# Patient Record
Sex: Female | Born: 1955 | Race: White | Hispanic: No | Marital: Single | Smoking: Former smoker
Health system: Southern US, Community
[De-identification: ages and names within clinical notes are randomized; demographics above are authoritative.]

## PROBLEM LIST (undated history)

## (undated) DIAGNOSIS — R569 Unspecified convulsions: Secondary | ICD-10-CM

## (undated) DIAGNOSIS — K219 Gastro-esophageal reflux disease without esophagitis: Secondary | ICD-10-CM

## (undated) DIAGNOSIS — I10 Essential (primary) hypertension: Secondary | ICD-10-CM

## (undated) DIAGNOSIS — R198 Other specified symptoms and signs involving the digestive system and abdomen: Secondary | ICD-10-CM

## (undated) DIAGNOSIS — F419 Anxiety disorder, unspecified: Secondary | ICD-10-CM

## (undated) DIAGNOSIS — C4490 Unspecified malignant neoplasm of skin, unspecified: Secondary | ICD-10-CM

## (undated) DIAGNOSIS — J45909 Unspecified asthma, uncomplicated: Secondary | ICD-10-CM

## (undated) DIAGNOSIS — E785 Hyperlipidemia, unspecified: Secondary | ICD-10-CM

## (undated) HISTORY — PX: COLONOSCOPY: SHX174

## (undated) HISTORY — PX: UPPER GI ENDOSCOPY: SHX6162

## (undated) HISTORY — PX: ABDOMINAL HYSTERECTOMY: SHX81

## (undated) HISTORY — PX: HERNIA REPAIR: SHX51

## (undated) HISTORY — PX: CHOLECYSTECTOMY: SHX55

## (undated) HISTORY — PX: BRAIN SURGERY: SHX531

---

## 2012-05-12 HISTORY — PX: CRANIOTOMY: SHX93

## 2014-02-09 ENCOUNTER — Emergency Department (HOSPITAL_COMMUNITY)
Admission: EM | Admit: 2014-02-09 | Discharge: 2014-02-10 | Disposition: A | Discharge status: Home / Self Care | Payer: Medicare Other | Attending: Emergency Medicine | Admitting: Emergency Medicine

## 2014-02-09 ENCOUNTER — Encounter (HOSPITAL_COMMUNITY): Payer: Self-pay | Admitting: Emergency Medicine

## 2014-02-09 DIAGNOSIS — R112 Nausea with vomiting, unspecified: Secondary | ICD-10-CM | POA: Insufficient documentation

## 2014-02-09 DIAGNOSIS — Z87891 Personal history of nicotine dependence: Secondary | ICD-10-CM | POA: Insufficient documentation

## 2014-02-09 DIAGNOSIS — Z8639 Personal history of other endocrine, nutritional and metabolic disease: Secondary | ICD-10-CM | POA: Diagnosis not present

## 2014-02-09 DIAGNOSIS — R202 Paresthesia of skin: Secondary | ICD-10-CM | POA: Insufficient documentation

## 2014-02-09 DIAGNOSIS — R197 Diarrhea, unspecified: Secondary | ICD-10-CM | POA: Insufficient documentation

## 2014-02-09 DIAGNOSIS — R109 Unspecified abdominal pain: Secondary | ICD-10-CM | POA: Diagnosis not present

## 2014-02-09 DIAGNOSIS — R509 Fever, unspecified: Secondary | ICD-10-CM | POA: Insufficient documentation

## 2014-02-09 DIAGNOSIS — R0789 Other chest pain: Secondary | ICD-10-CM | POA: Insufficient documentation

## 2014-02-09 DIAGNOSIS — I10 Essential (primary) hypertension: Secondary | ICD-10-CM | POA: Insufficient documentation

## 2014-02-09 DIAGNOSIS — Z8719 Personal history of other diseases of the digestive system: Secondary | ICD-10-CM | POA: Insufficient documentation

## 2014-02-09 DIAGNOSIS — Z8669 Personal history of other diseases of the nervous system and sense organs: Secondary | ICD-10-CM | POA: Diagnosis not present

## 2014-02-09 DIAGNOSIS — R42 Dizziness and giddiness: Secondary | ICD-10-CM | POA: Insufficient documentation

## 2014-02-09 DIAGNOSIS — R079 Chest pain, unspecified: Secondary | ICD-10-CM | POA: Diagnosis present

## 2014-02-09 HISTORY — DX: Hyperlipidemia, unspecified: E78.5

## 2014-02-09 HISTORY — DX: Essential (primary) hypertension: I10

## 2014-02-09 LAB — COMPREHENSIVE METABOLIC PANEL
ALT: 25 U/L (ref 0–35)
ANION GAP: 15 (ref 5–15)
AST: 25 U/L (ref 0–37)
Albumin: 4.5 g/dL (ref 3.5–5.2)
Alkaline Phosphatase: 68 U/L (ref 39–117)
BILIRUBIN TOTAL: 0.3 mg/dL (ref 0.3–1.2)
BUN: 10 mg/dL (ref 6–23)
CO2: 24 mEq/L (ref 19–32)
CREATININE: 0.78 mg/dL (ref 0.50–1.10)
Calcium: 9.6 mg/dL (ref 8.4–10.5)
Chloride: 105 mEq/L (ref 96–112)
GFR calc non Af Amer: >90 mL/min (ref >90)
Glucose, Bld: 87 mg/dL (ref 70–99)
POTASSIUM: 4 meq/L (ref 3.7–5.3)
Sodium: 144 mEq/L (ref 137–147)
Total Protein: 7.4 g/dL (ref 6.0–8.3)

## 2014-02-09 LAB — URINALYSIS, ROUTINE W REFLEX MICROSCOPIC
BILIRUBIN URINE: NEGATIVE
GLUCOSE, UA: NEGATIVE mg/dL
HGB URINE DIPSTICK: NEGATIVE
KETONES UR: 40 mg/dL — AB
LEUKOCYTES UA: NEGATIVE
Nitrite: NEGATIVE
Protein, ur: NEGATIVE mg/dL
Specific Gravity, Urine: 1.007 (ref 1.005–1.030)
Urobilinogen, UA: 0.2 mg/dL (ref 0.0–1.0)
pH: 5.5 (ref 5.0–8.0)

## 2014-02-09 LAB — CBC WITH DIFFERENTIAL/PLATELET
Basophils Absolute: 0 10*3/uL (ref 0.0–0.1)
Basophils Relative: 0 % (ref 0–1)
Eosinophils Absolute: 0.2 10*3/uL (ref 0.0–0.7)
Eosinophils Relative: 2 % (ref 0–5)
HEMATOCRIT: 38 % (ref 36.0–46.0)
HEMOGLOBIN: 12.7 g/dL (ref 12.0–15.0)
Lymphocytes Relative: 31 % (ref 12–46)
Lymphs Abs: 2.4 10*3/uL (ref 0.7–4.0)
MCH: 29.7 pg (ref 26.0–34.0)
MCHC: 33.4 g/dL (ref 30.0–36.0)
MCV: 89 fL (ref 78.0–100.0)
MONO ABS: 0.7 10*3/uL (ref 0.1–1.0)
MONOS PCT: 9 % (ref 3–12)
NEUTROS ABS: 4.5 10*3/uL (ref 1.7–7.7)
Neutrophils Relative %: 58 % (ref 43–77)
Platelets: 230 10*3/uL (ref 150–400)
RBC: 4.27 MIL/uL (ref 3.87–5.11)
RDW: 13.1 % (ref 11.5–15.5)
WBC: 7.8 10*3/uL (ref 4.0–10.5)

## 2014-02-09 LAB — I-STAT TROPONIN, ED
Troponin i, poc: 0 ng/mL (ref 0.00–0.08)
Troponin i, poc: 0 ng/mL (ref 0.00–0.08)

## 2014-02-09 LAB — LIPASE, BLOOD: LIPASE: 22 U/L (ref 11–59)

## 2014-02-09 MED ORDER — ONDANSETRON HCL 4 MG/2ML IJ SOLN
4.0000 mg | Freq: Once | INTRAMUSCULAR | Status: AC
  Administered 2014-02-09: 4 mg via INTRAVENOUS
  Filled 2014-02-09: qty 2

## 2014-02-09 MED ORDER — SODIUM CHLORIDE 0.9 % IV BOLUS (SEPSIS)
1000.0000 mL | Freq: Once | INTRAVENOUS | Status: AC
  Administered 2014-02-09: 1000 mL via INTRAVENOUS

## 2014-02-09 MED ORDER — LOPERAMIDE HCL 2 MG PO CAPS
4.0000 mg | ORAL_CAPSULE | Freq: Once | ORAL | Status: AC
  Administered 2014-02-09: 4 mg via ORAL
  Filled 2014-02-09: qty 2

## 2014-02-09 MED ORDER — LOPERAMIDE HCL 2 MG PO CAPS: 2.0000 mg | ORAL_CAPSULE | Freq: Four times a day (QID) | ORAL | Status: DC | PRN

## 2014-02-09 MED ORDER — ONDANSETRON 4 MG PO TBDP: 4.0000 mg | ORAL_TABLET | Freq: Three times a day (TID) | ORAL | Status: DC | PRN

## 2014-02-09 NOTE — ED Provider Notes (Signed)
TIME SEEN: 8:45 PM  CHIEF COMPLAINT: Nausea, vomiting, diarrhea, chest pain, lower abdominal pain, left foot burning  HPI: Patient is a 58 year old female with history of hypertension, hyperlipidemia, hiatal hernia, meningioma status post resection who presents to the emergency department with multiple complaints. She reports that since last Tuesday, 8 days ago she started having mild diarrhea that has progressed and she is also had nausea and vomiting over the past several days. She has had subjective fevers but no chills. She is also having a pressure in her suprapubic region but no dysuria, hematuria, urinary frequency or urgency. She denies any sick contacts or recent travel or antibiotic use. She was admitted to [facility] and discharged yesterday for intractable vomiting. She reports she had a CT scan which was normal. She has had a prior history of a cholecystectomy, hysterectomy and unilateral oophorectomy but she cannot remember which side.   She is also complaining of left sharp chest pain that has been intermittent for the past 2 days. She reports that it is always worse after taking her Klonopin. This is a chronic symptom for her but states it feels worse today. It is not exertional or pleuritic. No associated shortness of breath. She has had some lightheadedness. She is also complaining of feeling like his left foot is burning. No history of injury to this extremity. No numbness or weakness.  ROS: See HPI Constitutional: no fever  Eyes: no drainage  ENT: no runny nose   Cardiovascular:   chest pain  Resp: no SOB  GI:  vomiting GU: no dysuria Integumentary: no rash  Allergy: no hives  Musculoskeletal: no leg swelling  Neurological: no slurred speech ROS otherwise negative  PAST MEDICAL HISTORY/PAST SURGICAL HISTORY:  Past Medical History  Diagnosis Date  . Hypertension   . Hyperlipemia     MEDICATIONS:  Prior to Admission medications   Not on File     ALLERGIES:  No Known Allergies  SOCIAL HISTORY:  History  Substance Use Topics  . Smoking status: Former Research scientist (life sciences)  . Smokeless tobacco: Not on file  . Alcohol Use: No    FAMILY HISTORY: History reviewed. No pertinent family history.  EXAM: BP 156/66  Pulse 82  Temp(Src) 97.9 F (36.6 C) (Oral)  Resp 18  SpO2 97% CONSTITUTIONAL: Alert and oriented and responds appropriately to questions. Well-appearing; well-nourished HEAD: Normocephalic EYES: Conjunctivae clear, PERRL ENT: normal nose; no rhinorrhea; moist mucous membranes; pharynx without lesions noted NECK: Supple, no meningismus, no LAD  CARD: RRR; S1 and S2 appreciated; no murmurs, no clicks, no rubs, no gallops RESP: Normal chest excursion without splinting or tachypnea; breath sounds clear and equal bilaterally; no wheezes, no rhonchi, no rales,  ABD/GI: Normal bowel sounds; non-distended; soft, non-tender, no rebound, no guarding BACK:  The back appears normal and is non-tender to palpation, there is no CVA tenderness EXT: Normal ROM in all joints; non-tender to palpation; no edema; normal capillary refill; no cyanosis; no calf tenderness or swelling, 2+ DP pulses bilaterally, no induration or warmth or erythema; equal pulses in all extremities SKIN: Normal color for age and race; warm NEURO: Moves all extremities equally; sensation to light touch intact diffusely PSYCH: The patient's mood and manner are appropriate. Grooming and personal hygiene are appropriate.  MEDICAL DECISION MAKING: Patient here with multiple complaints. She is complaining of 8 days of nausea, vomiting and diarrhea. Her abdominal exam is benign. Labs ordered and triaged are normal. There is no leukocytosis, electrolyte abnormality. Urinalysis is  pending. Low suspicion for any surgical intra-abdominal pathology. Likely viral illness. Will treat symptomatically with Zofran, Imodium and IV fluids. We'll attempt to obtain records from [facility] hospital including recent CT scan results.   As for patient's chest pain, it appears to be very atypical. She does have risk factors for ACS but have low suspicion for this. Her EKG shows no ischemic changes or arrhythmia. First troponin is negative. Will obtain a second troponin and continue to monitor her on a cardiac monitor. Doubt pulmonary embolus or dissection she is very well-appearing, comfortable, equal pulses in all of her extremities, no shortness of breath, no tearing or ripping pain, no pleuritic chest pain.  As for the burning in her left foot, this may be related to neuropathy. She is neurovascularly intact distally. No sign of infection on exam. History of injury. I do not feel this needs emergent workup today when she can followup with her PCP for this.    ED PROGRESS: Records obtained from [facility] regional show the patient has a mild leukocytosis of 13.8 on 9/29 which resolved by 9/30. She also had negative cardiac enzyme on 9/29. CT scan showed mild diffuse fatty infiltration of the liver. There are air-fluid levels throughout the colon compatible with diarrhea. No colonic wall thickening. There is a periumbilical hernia that contains fat.. Pelvic cyst noted in the kidneys. Symptoms were thought secondary to gastroenteritis. She has not had any episodes of vomiting or diarrhea in the ED. Second troponin tonight is negative.  Urine shows no sign of infection. She has no risk factors for pulmonary embolus other than her recent admission but was having chest pain before that. Chest pain is very atypical and not associated with other symptoms. I feel she is safe to be discharged home. Discussed with patient she should followup with her primary care physician. Discussed with her that she should use Imodium over-the-counter for diarrhea. We'll discharge with Zofran. Discussed return precautions. She verbalized understanding and is comfortable with plan.   EKG  Interpretation  Date/Time:  Thursday February 09 2014 18:14:39 EDT Ventricular Rate:  82 PR Interval:  164 QRS Duration: 76 QT Interval:  352 QTC Calculation: 411 R Axis:   56 Text Interpretation:  Normal sinus rhythm Low voltage QRS Nonspecific ST abnormality Abnormal ECG Confirmed by Esra Frankowski,  DO, Giorgi Debruin ([id number]) on 02/09/2014 8:39:17 PM         Dresser, DO 02/09/14 2317

## 2014-02-09 NOTE — Discharge Instructions (Signed)
Chest Pain (Nonspecific) °It is often hard to give a specific diagnosis for the cause of chest pain. There is always a chance that your pain could be related to something serious, such as a heart attack or a blood clot in the lungs. You need to follow up with your health care provider for further evaluation. °CAUSES  °· Heartburn. °· Pneumonia or bronchitis. °· Anxiety or stress. °· Inflammation around your heart (pericarditis) or lung (pleuritis or pleurisy). °· A blood clot in the lung. °· A collapsed lung (pneumothorax). It can develop suddenly on its own (spontaneous pneumothorax) or from trauma to the chest. °· Shingles infection (herpes zoster virus). °The chest wall is composed of bones, muscles, and cartilage. Any of these can be the source of the pain. °· The bones can be bruised by injury. °· The muscles or cartilage can be strained by coughing or overwork. °· The cartilage can be affected by inflammation and become sore (costochondritis). °DIAGNOSIS  °Lab tests or other studies may be needed to find the cause of your pain. Your health care provider may have you take a test called an ambulatory electrocardiogram (ECG). An ECG records your heartbeat patterns over a 24-hour period. You may also have other tests, such as: °· Transthoracic echocardiogram (TTE). During echocardiography, sound waves are used to evaluate how blood flows through your heart. °· Transesophageal echocardiogram (TEE). °· Cardiac monitoring. This allows your health care provider to monitor your heart rate and rhythm in real time. °· Holter monitor. This is a portable device that records your heartbeat and can help diagnose heart arrhythmias. It allows your health care provider to track your heart activity for several days, if needed. °· Stress tests by exercise or by giving medicine that makes the heart beat faster. °TREATMENT  °· Treatment depends on what may be causing your chest pain. Treatment may include: °¨ Acid blockers for  heartburn. °¨ Anti-inflammatory medicine. °¨ Pain medicine for inflammatory conditions. °¨ Antibiotics if an infection is present. °· You may be advised to change lifestyle habits. This includes stopping smoking and avoiding alcohol, caffeine, and chocolate. °· You may be advised to keep your head raised (elevated) when sleeping. This reduces the chance of acid going backward from your stomach into your esophagus. °Most of the time, nonspecific chest pain will improve within 2-3 days with rest and mild pain medicine.  °HOME CARE INSTRUCTIONS  °· If antibiotics were prescribed, take them as directed. Finish them even if you start to feel better. °· For the next few days, avoid physical activities that bring on chest pain. Continue physical activities as directed. °· Do not use any tobacco products, including cigarettes, chewing tobacco, or electronic cigarettes. °· Avoid drinking alcohol. °· Only take medicine as directed by your health care provider. °· Follow your health care provider's suggestions for further testing if your chest pain does not go away. °· Keep any follow-up appointments you made. If you do not go to an appointment, you could develop lasting (chronic) problems with pain. If there is any problem keeping an appointment, call to reschedule. °SEEK MEDICAL CARE IF:  °· Your chest pain does not go away, even after treatment. °· You have a rash with blisters on your chest. °· You have a fever. °SEEK IMMEDIATE MEDICAL CARE IF:  °· You have increased chest pain or pain that spreads to your arm, neck, jaw, back, or abdomen. °· You have shortness of breath. °· You have an increasing cough, or you cough   up blood.  You have severe back or abdominal pain.  You feel nauseous or vomit.  You have severe weakness.  You faint.  You have chills. This is an emergency. Do not wait to see if the pain will go away. Get medical help at once. Call your local emergency services (911 in [place] Do not drive  yourself to the hospital. MAKE SURE YOU:   Understand these instructions.  Will watch your condition.  Will get help right away if you are not doing well or get worse. Document Released: 02/05/2005 Document Revised: 05/03/2013 Document Reviewed: 12/02/2007 [facility] [address] Patient Information 2015 [place], [place]. This information is not intended to replace advice given to you by your health care provider. Make sure you discuss any questions you have with your health care provider.    Viral Gastroenteritis Viral gastroenteritis is also known as stomach flu. This condition affects the stomach and intestinal tract. It can cause sudden diarrhea and vomiting. The illness typically lasts 3 to 8 days. Most people develop an immune response that eventually gets rid of the virus. While this natural response develops, the virus can make you quite ill. CAUSES  Many different viruses can cause gastroenteritis, such as rotavirus or noroviruses. You can catch one of these viruses by consuming contaminated food or water. You may also catch a virus by sharing utensils or other personal items with an infected person or by touching a contaminated surface. SYMPTOMS  The most common symptoms are diarrhea and vomiting. These problems can cause a severe loss of body fluids (dehydration) and a body salt (electrolyte) imbalance. Other symptoms may include:  Fever.  Headache.  Fatigue.  Abdominal pain. DIAGNOSIS  Your caregiver can usually diagnose viral gastroenteritis based on your symptoms and a physical exam. A stool sample may also be taken to test for the presence of viruses or other infections. TREATMENT  This illness typically goes away on its own. Treatments are aimed at rehydration. The most serious cases of viral gastroenteritis involve vomiting so severely that you are not able to keep fluids down. In these cases, fluids must be given through an intravenous line (IV). HOME CARE INSTRUCTIONS   Drink  enough fluids to keep your urine clear or pale yellow. Drink small amounts of fluids frequently and increase the amounts as tolerated.  Ask your caregiver for specific rehydration instructions.  Avoid:  Foods high in sugar.  Alcohol.  Carbonated drinks.  Tobacco.  Juice.  Caffeine drinks.  Extremely hot or cold fluids.  Fatty, greasy foods.  Too much intake of anything at one time.  Dairy products until 24 to 48 hours after diarrhea stops.  You may consume probiotics. Probiotics are active cultures of beneficial bacteria. They may lessen the amount and number of diarrheal stools in adults. Probiotics can be found in yogurt with active cultures and in supplements.  Wash your hands well to avoid spreading the virus.  Only take over-the-counter or prescription medicines for pain, discomfort, or fever as directed by your caregiver. Do not give aspirin to children. Antidiarrheal medicines are not recommended.  Ask your caregiver if you should continue to take your regular prescribed and over-the-counter medicines.  Keep all follow-up appointments as directed by your caregiver. SEEK IMMEDIATE MEDICAL CARE IF:   You are unable to keep fluids down.  You do not urinate at least once every 6 to 8 hours.  You develop shortness of breath.  You notice blood in your stool or vomit. This may look like coffee  grounds.  You have abdominal pain that increases or is concentrated in one small area (localized).  You have persistent vomiting or diarrhea.  You have a fever.  The patient is a child younger than 3 months, and he or she has a fever.  The patient is a child older than 3 months, and he or she has a fever and persistent symptoms.  The patient is a child older than 3 months, and he or she has a fever and symptoms suddenly get worse.  The patient is a baby, and he or she has no tears when crying. MAKE SURE YOU:   Understand these instructions.  Will watch your  condition.  Will get help right away if you are not doing well or get worse. Document Released: 04/28/2005 Document Revised: 07/21/2011 Document Reviewed: 02/12/2011 [facility] Patient Information 2015 [place], [place]. This information is not intended to replace advice given to you by your health care provider. Make sure you discuss any questions you have with your health care provider.

## 2014-02-09 NOTE — ED Notes (Addendum)
Left side chest pain, diarrhea (green watery stools), vomiting - cannot hold food down.  States, "feels like feet are burning." feels like she has having fevers. Diarrhea came before cp.

## 2014-03-30 ENCOUNTER — Encounter (HOSPITAL_BASED_OUTPATIENT_CLINIC_OR_DEPARTMENT_OTHER): Payer: Self-pay | Admitting: *Deleted

## 2014-03-30 NOTE — Progress Notes (Signed)
Pt poor historian, brother was there with her. She was in er 10/15 with n/v/d Put on immodium,carafate Says better Had a craniotomy 2014-[facility]-no seizures since then Will need istat ekg done 10/15

## 2014-03-30 NOTE — H&P (Signed)
  Subjective:    Patient ID: Monica Booth is a 58 y.o. female.  HPI  Referred by Dr. Danielle Dess following Mohs resection yesterday for wound closure/reconstruction. This is first skin cancer. 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 No seizures since.  Review of Systems     Objective:    Physical Exam  HENT:  Open wound right cheek preauricular up to but not involving lobule 5.5 x 3.5 cm, SMAS intact CN II-XII equal symmetric  Cardiovascular: Normal rate, regular rhythm and normal heart sounds.  Pulmonary/Chest: Effort normal and breath sounds normal.       Assessment:      Mohs resection cheek Open wound cheek complicated     Plan:      Plan local tissue transfer in OR under sedation. Pt declined to look at wound today and redressed and counseled ok to shower body. Counseled will have asymmetry face and will require months for scars to mature, soften. May have temporary nerve palsy from dissection. Also risk sialocele given location over parotid. Pictures taken.   Irene Limbo, MD [facility] Plastic & Reconstructive Surgery [phone]

## 2014-03-31 ENCOUNTER — Ambulatory Visit (HOSPITAL_BASED_OUTPATIENT_CLINIC_OR_DEPARTMENT_OTHER): Payer: Medicare Other | Admitting: Anesthesiology

## 2014-03-31 ENCOUNTER — Encounter (HOSPITAL_BASED_OUTPATIENT_CLINIC_OR_DEPARTMENT_OTHER): Payer: Self-pay | Admitting: *Deleted

## 2014-03-31 ENCOUNTER — Ambulatory Visit (HOSPITAL_BASED_OUTPATIENT_CLINIC_OR_DEPARTMENT_OTHER)
Admission: RE | Admit: 2014-03-31 | Discharge: 2014-03-31 | Disposition: A | Discharge status: Home / Self Care | Payer: Medicare Other | Source: Ambulatory Visit | Attending: Plastic Surgery | Admitting: Plastic Surgery

## 2014-03-31 ENCOUNTER — Encounter (HOSPITAL_BASED_OUTPATIENT_CLINIC_OR_DEPARTMENT_OTHER)
Admission: RE | Disposition: A | Discharge status: Home / Self Care | Payer: Self-pay | Source: Ambulatory Visit | Attending: Plastic Surgery

## 2014-03-31 DIAGNOSIS — E785 Hyperlipidemia, unspecified: Secondary | ICD-10-CM | POA: Diagnosis not present

## 2014-03-31 DIAGNOSIS — Y929 Unspecified place or not applicable: Secondary | ICD-10-CM | POA: Diagnosis not present

## 2014-03-31 DIAGNOSIS — I10 Essential (primary) hypertension: Secondary | ICD-10-CM | POA: Diagnosis not present

## 2014-03-31 DIAGNOSIS — J45909 Unspecified asthma, uncomplicated: Secondary | ICD-10-CM | POA: Insufficient documentation

## 2014-03-31 DIAGNOSIS — S01401A Unspecified open wound of right cheek and temporomandibular area, initial encounter: Secondary | ICD-10-CM | POA: Insufficient documentation

## 2014-03-31 DIAGNOSIS — Z87891 Personal history of nicotine dependence: Secondary | ICD-10-CM | POA: Insufficient documentation

## 2014-03-31 DIAGNOSIS — X58XXXA Exposure to other specified factors, initial encounter: Secondary | ICD-10-CM | POA: Diagnosis not present

## 2014-03-31 HISTORY — DX: Gastro-esophageal reflux disease without esophagitis: K21.9

## 2014-03-31 HISTORY — DX: Unspecified asthma, uncomplicated: J45.909

## 2014-03-31 HISTORY — DX: Other specified symptoms and signs involving the digestive system and abdomen: R19.8

## 2014-03-31 HISTORY — PX: SKIN SPLIT GRAFT: SHX444

## 2014-03-31 HISTORY — DX: Unspecified convulsions: R56.9

## 2014-03-31 HISTORY — DX: Anxiety disorder, unspecified: F41.9

## 2014-03-31 LAB — POCT I-STAT, CHEM 8
BUN: 10 mg/dL (ref 6–23)
CREATININE: 0.7 mg/dL (ref 0.50–1.10)
Calcium, Ion: 1.28 mmol/L — ABNORMAL HIGH (ref 1.12–1.23)
Chloride: 103 mEq/L (ref 96–112)
GLUCOSE: 117 mg/dL — AB (ref 70–99)
HCT: 42 % (ref 36.0–46.0)
HEMOGLOBIN: 14.3 g/dL (ref 12.0–15.0)
Potassium: 4.2 mEq/L (ref 3.7–5.3)
Sodium: 142 mEq/L (ref 137–147)
TCO2: 24 mmol/L (ref 0–100)

## 2014-03-31 SURGERY — APPLICATION, GRAFT, SKIN, SPLIT-THICKNESS
Anesthesia: General | Laterality: Right

## 2014-03-31 MED ORDER — PROPOFOL 10 MG/ML IV BOLUS
INTRAVENOUS | Status: AC
  Filled 2014-03-31: qty 80

## 2014-03-31 MED ORDER — LIDOCAINE HCL (CARDIAC) 20 MG/ML IV SOLN
INTRAVENOUS | Status: DC | PRN
  Administered 2014-03-31: 50 mg via INTRAVENOUS

## 2014-03-31 MED ORDER — ONDANSETRON HCL 4 MG/2ML IJ SOLN: 4.0000 mg | Freq: Once | INTRAMUSCULAR | Status: DC | PRN

## 2014-03-31 MED ORDER — GLYCOPYRROLATE 0.2 MG/ML IJ SOLN
INTRAMUSCULAR | Status: DC | PRN
  Administered 2014-03-31: 0.2 mg via INTRAVENOUS

## 2014-03-31 MED ORDER — CEFAZOLIN SODIUM-DEXTROSE 2-3 GM-% IV SOLR: 2.0000 g | INTRAVENOUS | Status: DC

## 2014-03-31 MED ORDER — PROPOFOL 10 MG/ML IV EMUL
INTRAVENOUS | Status: AC
  Filled 2014-03-31: qty 50

## 2014-03-31 MED ORDER — FENTANYL CITRATE 0.05 MG/ML IJ SOLN: 50.0000 ug | INTRAMUSCULAR | Status: DC | PRN

## 2014-03-31 MED ORDER — OXYCODONE HCL 5 MG PO TABS
5.0000 mg | ORAL_TABLET | Freq: Once | ORAL | Status: AC | PRN
  Administered 2014-03-31: 5 mg via ORAL

## 2014-03-31 MED ORDER — BUPIVACAINE-EPINEPHRINE 0.25% -1:200000 IJ SOLN
INTRAMUSCULAR | Status: DC | PRN
  Administered 2014-03-31: 9 mL

## 2014-03-31 MED ORDER — HYDROCODONE-ACETAMINOPHEN 5-325 MG PO TABS: 1.0000 | ORAL_TABLET | Freq: Four times a day (QID) | ORAL | Status: DC | PRN

## 2014-03-31 MED ORDER — SUCCINYLCHOLINE CHLORIDE 20 MG/ML IJ SOLN
INTRAMUSCULAR | Status: DC | PRN
  Administered 2014-03-31: 50 mg via INTRAVENOUS

## 2014-03-31 MED ORDER — PROPOFOL 10 MG/ML IV BOLUS
INTRAVENOUS | Status: DC | PRN
  Administered 2014-03-31: 200 mg via INTRAVENOUS
  Administered 2014-03-31: 100 mg via INTRAVENOUS

## 2014-03-31 MED ORDER — CEFAZOLIN SODIUM-DEXTROSE 2-3 GM-% IV SOLR
INTRAVENOUS | Status: AC
  Filled 2014-03-31: qty 50

## 2014-03-31 MED ORDER — FENTANYL CITRATE 0.05 MG/ML IJ SOLN
INTRAMUSCULAR | Status: AC
  Filled 2014-03-31: qty 2

## 2014-03-31 MED ORDER — ALBUTEROL SULFATE HFA 108 (90 BASE) MCG/ACT IN AERS
INHALATION_SPRAY | RESPIRATORY_TRACT | Status: DC | PRN
  Administered 2014-03-31 (×2): 2 via RESPIRATORY_TRACT

## 2014-03-31 MED ORDER — LACTATED RINGERS IV SOLN
INTRAVENOUS | Status: DC
  Administered 2014-03-31 (×2): via INTRAVENOUS

## 2014-03-31 MED ORDER — FENTANYL CITRATE 0.05 MG/ML IJ SOLN
25.0000 ug | INTRAMUSCULAR | Status: DC | PRN
  Administered 2014-03-31: 25 ug via INTRAVENOUS
  Administered 2014-03-31 (×2): 50 ug via INTRAVENOUS

## 2014-03-31 MED ORDER — DEXAMETHASONE SODIUM PHOSPHATE 4 MG/ML IJ SOLN
INTRAMUSCULAR | Status: DC | PRN
  Administered 2014-03-31: 10 mg via INTRAVENOUS

## 2014-03-31 MED ORDER — CEFAZOLIN SODIUM-DEXTROSE 2-3 GM-% IV SOLR
INTRAVENOUS | Status: DC | PRN
  Administered 2014-03-31: 2 g via INTRAVENOUS

## 2014-03-31 MED ORDER — FENTANYL CITRATE 0.05 MG/ML IJ SOLN
INTRAMUSCULAR | Status: DC | PRN
  Administered 2014-03-31: 100 ug via INTRAVENOUS
  Administered 2014-03-31 (×2): 25 ug via INTRAVENOUS

## 2014-03-31 MED ORDER — BACITRACIN 500 UNIT/GM EX OINT
TOPICAL_OINTMENT | CUTANEOUS | Status: DC | PRN
  Administered 2014-03-31: 1 via TOPICAL

## 2014-03-31 MED ORDER — MIDAZOLAM HCL 2 MG/2ML IJ SOLN
INTRAMUSCULAR | Status: AC
  Filled 2014-03-31: qty 2

## 2014-03-31 MED ORDER — MIDAZOLAM HCL 5 MG/5ML IJ SOLN
INTRAMUSCULAR | Status: DC | PRN
  Administered 2014-03-31: 2 mg via INTRAVENOUS

## 2014-03-31 MED ORDER — FENTANYL CITRATE 0.05 MG/ML IJ SOLN
INTRAMUSCULAR | Status: AC
  Filled 2014-03-31: qty 6

## 2014-03-31 MED ORDER — MIDAZOLAM HCL 2 MG/2ML IJ SOLN: 1.0000 mg | INTRAMUSCULAR | Status: DC | PRN

## 2014-03-31 MED ORDER — OXYCODONE HCL 5 MG PO TABS
ORAL_TABLET | ORAL | Status: AC
  Filled 2014-03-31: qty 1

## 2014-03-31 MED ORDER — ONDANSETRON HCL 4 MG/2ML IJ SOLN
INTRAMUSCULAR | Status: DC | PRN
  Administered 2014-03-31: 4 mg via INTRAVENOUS

## 2014-03-31 SURGICAL SUPPLY — 74 items
BANDAGE ELASTIC 3 VELCRO ST LF (GAUZE/BANDAGES/DRESSINGS) IMPLANT
BANDAGE ELASTIC 4 VELCRO ST LF (GAUZE/BANDAGES/DRESSINGS) IMPLANT
BANDAGE ELASTIC 6 VELCRO ST LF (GAUZE/BANDAGES/DRESSINGS) IMPLANT
BENZOIN TINCTURE PRP APPL 2/3 (GAUZE/BANDAGES/DRESSINGS) IMPLANT
BLADE CLIPPER SURG (BLADE) IMPLANT
BLADE DERMATOME SS (BLADE) IMPLANT
BLADE SURG 10 STRL SS (BLADE) IMPLANT
BLADE SURG 15 STRL LF DISP TIS (BLADE) ×1 IMPLANT
BLADE SURG 15 STRL SS (BLADE) ×2
BNDG GAUZE ELAST 4 BULKY (GAUZE/BANDAGES/DRESSINGS) ×3 IMPLANT
CANISTER SUCT 1200ML W/VALVE (MISCELLANEOUS) IMPLANT
CLOSURE WOUND 1/2 X4 (GAUZE/BANDAGES/DRESSINGS)
COTTONBALL LRG STERILE PKG (GAUZE/BANDAGES/DRESSINGS) IMPLANT
COVER BACK TABLE 60X90IN (DRAPES) ×3 IMPLANT
COVER MAYO STAND STRL (DRAPES) ×3 IMPLANT
DECANTER SPIKE VIAL GLASS SM (MISCELLANEOUS) IMPLANT
DERMACARRIERS GRAFT 1 TO 1.5 (DISPOSABLE)
DRAPE PED LAPAROTOMY (DRAPES) IMPLANT
DRAPE SURG 17X23 STRL (DRAPES) IMPLANT
DRAPE U-SHAPE 76X120 STRL (DRAPES) ×3 IMPLANT
DRSG ADAPTIC 3X8 NADH LF (GAUZE/BANDAGES/DRESSINGS) IMPLANT
DRSG EMULSION OIL 3X3 NADH (GAUZE/BANDAGES/DRESSINGS) IMPLANT
DRSG PAD ABDOMINAL 8X10 ST (GAUZE/BANDAGES/DRESSINGS) IMPLANT
ELECT COATED BLADE 2.86 ST (ELECTRODE) ×3 IMPLANT
ELECT NEEDLE BLADE 2-5/6 (NEEDLE) ×3 IMPLANT
ELECT REM PT RETURN 9FT ADLT (ELECTROSURGICAL) ×3
ELECTRODE REM PT RTRN 9FT ADLT (ELECTROSURGICAL) ×1 IMPLANT
GAUZE SPONGE 4X4 12PLY STRL (GAUZE/BANDAGES/DRESSINGS) IMPLANT
GAUZE XEROFORM 1X8 LF (GAUZE/BANDAGES/DRESSINGS) IMPLANT
GLOVE BIO SURGEON STRL SZ 6 (GLOVE) ×6 IMPLANT
GLOVE BIO SURGEON STRL SZ 6.5 (GLOVE) IMPLANT
GLOVE BIO SURGEONS STRL SZ 6.5 (GLOVE)
GLOVE ECLIPSE 6.5 STRL STRAW (GLOVE) ×3 IMPLANT
GOWN STRL REUS W/ TWL LRG LVL3 (GOWN DISPOSABLE) ×2 IMPLANT
GOWN STRL REUS W/TWL LRG LVL3 (GOWN DISPOSABLE) ×4
GRAFT DERMACARRIERS 1 TO 1.5 (DISPOSABLE) IMPLANT
LIQUID BAND (GAUZE/BANDAGES/DRESSINGS) IMPLANT
NEEDLE 27GAX1X1/2 (NEEDLE) ×3 IMPLANT
NS IRRIG 1000ML POUR BTL (IV SOLUTION) IMPLANT
PACK BASIN DAY SURGERY FS (CUSTOM PROCEDURE TRAY) ×3 IMPLANT
PAD CAST 3X4 CTTN HI CHSV (CAST SUPPLIES) IMPLANT
PAD CAST 4YDX4 CTTN HI CHSV (CAST SUPPLIES) IMPLANT
PADDING CAST COTTON 3X4 STRL (CAST SUPPLIES)
PADDING CAST COTTON 4X4 STRL (CAST SUPPLIES)
PENCIL BUTTON HOLSTER BLD 10FT (ELECTRODE) ×3 IMPLANT
SHEET MEDIUM DRAPE 40X70 STRL (DRAPES) ×3 IMPLANT
SPONGE GAUZE 4X4 12PLY STER LF (GAUZE/BANDAGES/DRESSINGS) IMPLANT
SPONGE LAP 18X18 X RAY DECT (DISPOSABLE) ×3 IMPLANT
STAPLER VISISTAT 35W (STAPLE) ×3 IMPLANT
STOCKINETTE 4X48 STRL (DRAPES) IMPLANT
STOCKINETTE 6  STRL (DRAPES)
STOCKINETTE 6 STRL (DRAPES) IMPLANT
STOCKINETTE IMPERVIOUS LG (DRAPES) IMPLANT
STRIP CLOSURE SKIN 1/2X4 (GAUZE/BANDAGES/DRESSINGS) IMPLANT
SURGILUBE 2OZ TUBE FLIPTOP (MISCELLANEOUS) IMPLANT
SUT CHROMIC 4 0 PS 2 18 (SUTURE) IMPLANT
SUT CHROMIC 5 0 P 3 (SUTURE) IMPLANT
SUT MNCRL AB 4-0 PS2 18 (SUTURE) IMPLANT
SUT PROLENE 5 0 P 3 (SUTURE) ×3 IMPLANT
SUT PROLENE 6 0 P 1 18 (SUTURE) ×6 IMPLANT
SUT SILK 3 0 SH CR/8 (SUTURE) IMPLANT
SUT SILK 4 0 SH CR/8 (SUTURE) IMPLANT
SUT VIC AB 4-0 P-3 18XBRD (SUTURE) ×1 IMPLANT
SUT VIC AB 4-0 P3 18 (SUTURE) ×2
SUT VIC AB 5-0 P-3 18X BRD (SUTURE) ×1 IMPLANT
SUT VIC AB 5-0 P3 18 (SUTURE) ×2
SYR BULB 3OZ (MISCELLANEOUS) ×3 IMPLANT
SYR CONTROL 10ML LL (SYRINGE) ×3 IMPLANT
TOWEL OR 17X24 6PK STRL BLUE (TOWEL DISPOSABLE) ×6 IMPLANT
TRAY DSU PREP LF (CUSTOM PROCEDURE TRAY) ×6 IMPLANT
TUBE CONNECTING 20'X1/4 (TUBING)
TUBE CONNECTING 20X1/4 (TUBING) IMPLANT
UNDERPAD 30X30 INCONTINENT (UNDERPADS AND DIAPERS) ×3 IMPLANT
YANKAUER SUCT BULB TIP NO VENT (SUCTIONS) IMPLANT

## 2014-03-31 NOTE — Transfer of Care (Signed)
Immediate Anesthesia Transfer of Care Note  Patient: Monica Booth  Procedure(s) Performed: Procedure(s): ADJACENT TISSUE TRANSFER RIGHT CHEEK (Right)  Patient Location: PACU  Anesthesia Type:General  Level of Consciousness: awake, oriented and patient cooperative  Airway & Oxygen Therapy: Patient Spontanous Breathing and Patient connected to face mask oxygen  Post-op Assessment: Report given to PACU RN  Post vital signs: Reviewed and stable  Complications: No apparent anesthesia complications

## 2014-03-31 NOTE — Op Note (Signed)
Operative Note   DATE OF OPERATION: 11.20.2015  LOCATION: [place]- outpatient  SURGICAL DIVISION: Plastic Surgery  PREOPERATIVE DIAGNOSES:  1. Mohs resection basal cell carcinoma 2. Open wound right cheek  POSTOPERATIVE DIAGNOSES:  same  PROCEDURE:  Adjacent tissue transfer right cheek 45 cm2.  SURGEON: Irene Limbo MD MBA  ASSISTANT: none  ANESTHESIA:  General.   EBL: minimal  COMPLICATIONS: None.   INDICATIONS FOR PROCEDURE:  The patient, Monica Booth, is a 58 y.o. female born on 03-04-1956, is here for closure defect right preauricular cheek following Mohs resection basal cell carcinoma.   FINDINGS: 5 x 4 cm defect right preauricular cheek down to parotid fascia  DESCRIPTION OF PROCEDURE:  The patient's operative site was marked with the patient in the preoperative area. The patient was taken to the operating room. IV antibiotics were given. The patient's operative site was prepped and draped in a sterile fashion. A time out was performed and all information was confirmed to be correct.  Local anesthetic infiltrated to perform right infraorbital and mental nerve block as well as surrounding open wound. Rhomboid flap designed based inferiorly and posteriorly to wound. Sharp incision of flap margins completed and elevated in plane just superior to plastyma muscle. Additional undermining completed in subcutaneous plane surrounding open wound. Following elevation of flap, donor site closed primarily with interrupted 4-0 and 5-0 vicryl in dermis. Flap trimmed and inset over defect in similar fashion. Redundant skin posterior to lobule excised and lobule inset with interrupted 5-0 vicryl in dermis. Skin closure completed with interrupted and short running 5-0 and 6-0 prolene suture. Antibiotic ointment applied.   The patient was allowed to wake from anesthesia, extubated and taken to the recovery room in satisfactory condition.   SPECIMENS: none  DRAINS: none  Irene Limbo, MD [facility] Plastic & Reconstructive Surgery [phone]

## 2014-03-31 NOTE — Anesthesia Procedure Notes (Signed)
Procedure Name: Intubation Date/Time: 03/31/2014 7:33 AM Performed by: Marrianne Mood Pre-anesthesia Checklist: Patient identified, Emergency Drugs available, Suction available, Patient being monitored and Timeout performed Patient Re-evaluated:Patient Re-evaluated prior to inductionOxygen Delivery Method: Circle System Utilized Preoxygenation: Pre-oxygenation with 100% oxygen Intubation Type: IV induction Ventilation: Mask ventilation without difficulty LMA Size: 4.0 Laryngoscope Size: Miller and 3 Grade View: Grade III Tube type: Oral Tube size: 7.0 mm Number of attempts: 1 Airway Equipment and Method: stylet and oral airway Placement Confirmation: ETT inserted through vocal cords under direct vision,  positive ETCO2 and breath sounds checked- equal and bilateral Secured at: 20 cm Tube secured with: Tape Dental Injury: Teeth and Oropharynx as per pre-operative assessment  Comments: LMA placed without difficulty. During surgical prep, with patient's head turned left, ventilations became difficult for asthmatic patient with LMA.  Dr. Al Corpus called to OR 4 to assist with intubation.  All went smoothly as LMA removed, ETT inserted.

## 2014-03-31 NOTE — Discharge Instructions (Signed)

## 2014-03-31 NOTE — Anesthesia Preprocedure Evaluation (Signed)
Anesthesia Evaluation  Patient identified by MRN, date of birth, ID band Patient awake    Reviewed: Allergy & Precautions, H&P , NPO status , Patient's Chart, lab work & pertinent test results  Airway Mallampati: II  TM Distance: >3 FB Neck ROM: Full    Dental  (+) Teeth Intact, Dental Advisory Given   Pulmonary asthma , former smoker,  breath sounds clear to auscultation        Cardiovascular hypertension, Pt. on medications Rhythm:Regular Rate:Normal     Neuro/Psych    GI/Hepatic GERD-  Medicated and Controlled,  Endo/Other    Renal/GU      Musculoskeletal   Abdominal   Peds  Hematology   Anesthesia Other Findings   Reproductive/Obstetrics                             Anesthesia Physical Anesthesia Plan  ASA: III  Anesthesia Plan: General   Post-op Pain Management:    Induction: Intravenous  Airway Management Planned: LMA  Additional Equipment:   Intra-op Plan:   Post-operative Plan: Extubation in OR  Informed Consent: I have reviewed the patients History and Physical, chart, labs and discussed the procedure including the risks, benefits and alternatives for the proposed anesthesia with the patient or authorized representative who has indicated his/her understanding and acceptance.   Dental advisory given  Plan Discussed with: CRNA, Anesthesiologist and Surgeon  Anesthesia Plan Comments:         Anesthesia Quick Evaluation

## 2014-03-31 NOTE — Interval H&P Note (Signed)
History and Physical Interval Note:  03/31/2014 7:04 AM  Monica Booth  has presented today for surgery, with the diagnosis of open wound right cheek mohs defect right cheek  The various methods of treatment have been discussed with the patient and family. After consideration of risks, benefits and other options for treatment, the patient has consented to  Procedure(s): ADJACENT TISSUE TRANSFER RIGHT CHEEK (Right) as a surgical intervention .  The patient's history has been reviewed, patient examined, no change in status, stable for surgery.  I have reviewed the patient's chart and labs.  Questions were answered to the patient's satisfaction.     Ellese Julius

## 2014-03-31 NOTE — Anesthesia Postprocedure Evaluation (Signed)
  Anesthesia Post-op Note  Patient: Monica Booth  Procedure(s) Performed: Procedure(s): ADJACENT TISSUE TRANSFER RIGHT CHEEK (Right)  Patient Location: PACU  Anesthesia Type: General   Level of Consciousness: awake, alert  and oriented  Airway and Oxygen Therapy: Patient Spontanous Breathing  Post-op Pain: mild  Post-op Assessment: Post-op Vital signs reviewed  Post-op Vital Signs: Reviewed  Last Vitals:  Filed Vitals:   03/31/14 1115  BP: 128/70  Pulse: 86  Temp: 37 C  Resp: 20    Complications: No apparent anesthesia complications

## 2014-04-03 ENCOUNTER — Encounter (HOSPITAL_BASED_OUTPATIENT_CLINIC_OR_DEPARTMENT_OTHER): Payer: Self-pay | Admitting: Plastic Surgery

## 2014-05-16 ENCOUNTER — Emergency Department (HOSPITAL_COMMUNITY): Payer: Medicare Other

## 2014-05-16 ENCOUNTER — Emergency Department (HOSPITAL_COMMUNITY)
Admission: EM | Admit: 2014-05-16 | Discharge: 2014-05-16 | Disposition: A | Discharge status: Home / Self Care | Payer: Medicare Other | Attending: Emergency Medicine | Admitting: Emergency Medicine

## 2014-05-16 ENCOUNTER — Encounter (HOSPITAL_COMMUNITY): Payer: Self-pay | Admitting: Emergency Medicine

## 2014-05-16 DIAGNOSIS — K219 Gastro-esophageal reflux disease without esophagitis: Secondary | ICD-10-CM | POA: Diagnosis not present

## 2014-05-16 DIAGNOSIS — Z7951 Long term (current) use of inhaled steroids: Secondary | ICD-10-CM | POA: Insufficient documentation

## 2014-05-16 DIAGNOSIS — R05 Cough: Secondary | ICD-10-CM | POA: Diagnosis present

## 2014-05-16 DIAGNOSIS — Z79899 Other long term (current) drug therapy: Secondary | ICD-10-CM | POA: Diagnosis not present

## 2014-05-16 DIAGNOSIS — Z87891 Personal history of nicotine dependence: Secondary | ICD-10-CM | POA: Diagnosis not present

## 2014-05-16 DIAGNOSIS — F419 Anxiety disorder, unspecified: Secondary | ICD-10-CM | POA: Diagnosis not present

## 2014-05-16 DIAGNOSIS — J45901 Unspecified asthma with (acute) exacerbation: Secondary | ICD-10-CM | POA: Diagnosis not present

## 2014-05-16 DIAGNOSIS — R059 Cough, unspecified: Secondary | ICD-10-CM

## 2014-05-16 DIAGNOSIS — J209 Acute bronchitis, unspecified: Secondary | ICD-10-CM | POA: Insufficient documentation

## 2014-05-16 DIAGNOSIS — J40 Bronchitis, not specified as acute or chronic: Secondary | ICD-10-CM

## 2014-05-16 DIAGNOSIS — E785 Hyperlipidemia, unspecified: Secondary | ICD-10-CM | POA: Diagnosis not present

## 2014-05-16 DIAGNOSIS — I10 Essential (primary) hypertension: Secondary | ICD-10-CM | POA: Diagnosis not present

## 2014-05-16 DIAGNOSIS — G40909 Epilepsy, unspecified, not intractable, without status epilepticus: Secondary | ICD-10-CM | POA: Insufficient documentation

## 2014-05-16 MED ORDER — DOXYCYCLINE HYCLATE 100 MG PO CAPS: 100.0000 mg | ORAL_CAPSULE | Freq: Two times a day (BID) | ORAL | Status: DC

## 2014-05-16 MED ORDER — ALBUTEROL SULFATE HFA 108 (90 BASE) MCG/ACT IN AERS
4.0000 | INHALATION_SPRAY | Freq: Once | RESPIRATORY_TRACT | Status: AC
Start: 2014-05-16 — End: 2014-05-16
  Administered 2014-05-16: 4 via RESPIRATORY_TRACT
  Filled 2014-05-16: qty 6.7

## 2014-05-16 MED ORDER — BENZONATATE 100 MG PO CAPS: 100.0000 mg | ORAL_CAPSULE | Freq: Two times a day (BID) | ORAL | Status: DC | PRN

## 2014-05-16 NOTE — ED Notes (Signed)
Patient complains of chest congestion with fever and chills; with upper left sided back pain that radiates to left shoulder.

## 2014-05-16 NOTE — ED Provider Notes (Signed)
CSN: [id number]     Arrival date & time 05/16/14  0932 History   First MD Initiated Contact with Patient 05/16/14 [phone]     Chief Complaint  Patient presents with  . URI     (Consider location/radiation/quality/duration/timing/severity/associated sxs/prior Treatment) Patient is a 59 y.o. female presenting with URI.  URI Presenting symptoms: congestion, cough and fever (subjective)   Cough:    Cough characteristics:  Productive   Sputum characteristics:  Yellow   Severity:  Moderate   Onset quality:  Gradual   Progression:  Worsening Severity:  Moderate Onset quality:  Gradual Duration:  4 weeks Timing:  Constant Progression:  Worsening Chronicity:  New Relieved by: OTC cough medicine.   Ineffective treatments: two courses of Azithromycin. Associated symptoms comment:  Had left shoulder pain about a week ago.   Past Medical History  Diagnosis Date  . Hypertension   . Hyperlipemia   . GERD (gastroesophageal reflux disease)   . Anxiety   . Seizures   . Asthma   . GI problem     freq n/v   Past Surgical History  Procedure Laterality Date  . Cholecystectomy    . Abdominal hysterectomy    . Craniotomy  2014    [facility]  . Colonoscopy    . Upper gi endoscopy    . Hernia repair      umb  . Skin split graft Right 03/31/2014    Procedure: ADJACENT TISSUE TRANSFER RIGHT CHEEK;  Surgeon: Irene Limbo, MD;  Location: [place];  Service: Plastics;  Laterality: Right;   No family history on file. History  Substance Use Topics  . Smoking status: Former Smoker    Quit date: 03/30/1989  . Smokeless tobacco: Not on file  . Alcohol Use: No   OB History    No data available     Review of Systems  Constitutional: Positive for fever (subjective).  HENT: Positive for congestion.   Respiratory: Positive for cough.   All other systems reviewed and are negative.     Allergies  Review of patient's allergies indicates no known  allergies.  Home Medications   Prior to Admission medications   Medication Sig Start Date End Date Taking? Authorizing Provider  albuterol (PROVENTIL HFA;VENTOLIN HFA) 108 (90 BASE) MCG/ACT inhaler Inhale 2 puffs into the lungs every 6 (six) hours as needed for wheezing or shortness of breath.    Historical Provider, MD  amLODipine (NORVASC) 5 MG tablet Take 5 mg by mouth daily.    Historical Provider, MD  budesonide-formoterol (SYMBICORT) 160-4.5 MCG/ACT inhaler Inhale 2 puffs into the lungs 2 (two) times daily.    Historical Provider, MD  cholecalciferol (VITAMIN D) 1000 UNITS tablet Take 1,000 Units by mouth daily.    Historical Provider, MD  clonazePAM (KLONOPIN) 0.5 MG tablet Take 0.5 mg by mouth 2 (two) times daily.    Historical Provider, MD  diazepam (DIASTAT ACUDIAL) 10 MG GEL Place 12.5 mg rectally once.    Historical Provider, MD  esomeprazole (NEXIUM) 40 MG capsule Take 40 mg by mouth daily.    Historical Provider, MD  fluticasone (FLONASE) 50 MCG/ACT nasal spray Place 2 sprays into both nostrils daily.    Historical Provider, MD  HYDROcodone-acetaminophen (NORCO/VICODIN) 5-325 MG per tablet Take 1 tablet by mouth every 6 (six) hours as needed for moderate pain. 03/31/14   Irene Limbo, MD  levETIRAcetam (KEPPRA) 500 MG tablet Take 500 mg by mouth 2 (two) times daily.    Historical  Provider, MD  lisinopril (PRINIVIL,ZESTRIL) 40 MG tablet Take 40 mg by mouth daily.    Historical Provider, MD  loperamide (IMODIUM) 2 MG capsule Take 1 capsule (2 mg total) by mouth 4 (four) times daily as needed for diarrhea or loose stools. 02/09/14   Kristen N Ward, DO  metoCLOPramide (REGLAN) 10 MG tablet Take 10 mg by mouth 2 (two) times daily.    Historical Provider, MD  ondansetron (ZOFRAN ODT) 4 MG disintegrating tablet Take 1 tablet (4 mg total) by mouth every 8 (eight) hours as needed for nausea or vomiting. 02/09/14   Kristen N Ward, DO  OVER THE COUNTER MEDICATION Place 2 drops into both eyes  3 (three) times daily. "OTC [employer] Brand Redness Relief"    Historical Provider, MD  Potassium 99 MG TABS Take 99 mg by mouth daily as needed (for feeling weak).    Historical Provider, MD  simvastatin (ZOCOR) 40 MG tablet Take 40 mg by mouth daily.    Historical Provider, MD  sucralfate (CARAFATE) 1 G tablet Take 1 g by mouth 4 (four) times daily -  with meals and at bedtime.    Historical Provider, MD  traMADol (ULTRAM) 50 MG tablet Take by mouth every 6 (six) hours as needed.    Historical Provider, MD  zolpidem (AMBIEN) 10 MG tablet Take 10 mg by mouth at bedtime.    Historical Provider, MD   BP 160/72 mmHg  Pulse 91  Temp(Src) 97.9 F (36.6 C) (Oral)  Resp 18  Ht 5\' 1"  (1.549 m)  Wt 200 lb (90.719 kg)  BMI 37.81 kg/m2  SpO2 100% Physical Exam  Constitutional: She is oriented to person, place, and time. She appears well-developed and well-nourished. No distress.  HENT:  Head: Normocephalic and atraumatic.  Mouth/Throat: Oropharynx is clear and moist.  Eyes: Conjunctivae are normal. Pupils are equal, round, and reactive to light. No scleral icterus.  Neck: Neck supple.  Cardiovascular: Normal rate, regular rhythm, normal heart sounds and intact distal pulses.   No murmur heard. Pulmonary/Chest: Effort normal and breath sounds normal. No stridor. No respiratory distress. She has no rales.  Mild wheezing diffusely. Frequent cough.  Abdominal: Soft. Bowel sounds are normal. She exhibits no distension. There is no tenderness.  Musculoskeletal: Normal range of motion.  Neurological: She is alert and oriented to person, place, and time.  Skin: Skin is warm and dry. No rash noted.  Psychiatric: She has a normal mood and affect. Her behavior is normal.  Nursing note and vitals reviewed.   ED Course  Procedures (including critical care time) Labs Review Labs Reviewed - No data to display  Imaging Review Dg Chest 2 View  05/16/2014   CLINICAL DATA:  One month of cough  congestion and weakness unresponsive to treatment; history of asthma  EXAM: CHEST  2 VIEW  COMPARISON:  PA and lateral chest x-ray of May 04, 2014  FINDINGS: The lungs are adequately inflated. There is no focal infiltrate. The pulmonary interstitial markings are coarse. The heart is top-normal in size but stable. The pulmonary vascularity is not engorged. The mediastinum is normal in width. There is no pleural effusion or pneumothorax. The bony thorax is unremarkable.  IMPRESSION: There is no evidence of pneumonia nor CHF. Mild prominence of the pulmonary interstitium likely reflects the patient's asthma.   Electronically Signed   By: David  Martinique   On: 05/16/2014 12:46  All radiology studies independently viewed by me.      EKG Interpretation  Date/Time:  Tuesday May 16 2014 09:43:43 EST Ventricular Rate:  85 PR Interval:  153 QRS Duration: 92 QT Interval:  341 QTC Calculation: 405 R Axis:   -6 Text Interpretation:  Sinus rhythm Low voltage, precordial leads  Borderline T abnormalities, diffuse leads No significant change was found  Confirmed by [facility]  MD, TREY ([id number]) on 05/16/2014 10:55:08 AM      MDM   Final diagnoses:  Cough  Bronchitis    1 month of URI symptoms now with worsening cough becoming productive of yellow sputum.  Nontoxic appearance, stable vital signs.  Plan CXR to eval for PNA, albuterol for symptom control.    Remained well appearing, nontoxic.  CXR without signs of pneumonia.  Ambulated without hypoxia.  Plan dc with Doxy and PCP follow up.    Houston Siren III, MD 05/16/14 [phone]

## 2014-05-16 NOTE — ED Notes (Signed)
Dr. Doy Mince at bedside.

## 2014-05-16 NOTE — ED Notes (Signed)
Pt to xray

## 2014-05-16 NOTE — Discharge Instructions (Signed)
Cough, Adult  A cough is a reflex that helps clear your throat and airways. It can help heal the body or may be a reaction to an irritated airway. A cough may only last 2 or 3 weeks (acute) or may last more than 8 weeks (chronic).  CAUSES Acute cough:  Viral or bacterial infections. Chronic cough:  Infections.  Allergies.  Asthma.  Post-nasal drip.  Smoking.  Heartburn or acid reflux.  Some medicines.  Chronic lung problems (COPD).  Cancer. SYMPTOMS   Cough.  Fever.  Chest pain.  Increased breathing rate.  High-pitched whistling sound when breathing (wheezing).  Colored mucus that you cough up (sputum). TREATMENT   A bacterial cough may be treated with antibiotic medicine.  A viral cough must run its course and will not respond to antibiotics.  Your caregiver may recommend other treatments if you have a chronic cough. HOME CARE INSTRUCTIONS   Only take over-the-counter or prescription medicines for pain, discomfort, or fever as directed by your caregiver. Use cough suppressants only as directed by your caregiver.  Use a cold steam vaporizer or humidifier in your bedroom or home to help loosen secretions.  Sleep in a semi-upright position if your cough is worse at night.  Rest as needed.  Stop smoking if you smoke. SEEK IMMEDIATE MEDICAL CARE IF:   You have pus in your sputum.  Your cough starts to worsen.  You cannot control your cough with suppressants and are losing sleep.  You begin coughing up blood.  You have difficulty breathing.  You develop pain which is getting worse or is uncontrolled with medicine.  You have a fever. MAKE SURE YOU:   Understand these instructions.  Will watch your condition.  Will get help right away if you are not doing well or get worse. Document Released: 10/25/2010 Document Revised: 07/21/2011 Document Reviewed: 10/25/2010 Exit[employer] Patient Information 2015 [employer]. This information is not intended  to replace advice given to you by your health care provider. Make sure you discuss any questions you have with your health care provider.  

## 2014-11-07 ENCOUNTER — Emergency Department (HOSPITAL_COMMUNITY)
Admission: EM | Admit: 2014-11-07 | Discharge: 2014-11-07 | Disposition: A | Discharge status: Home / Self Care | Payer: Medicare Other | Attending: Emergency Medicine | Admitting: Emergency Medicine

## 2014-11-07 ENCOUNTER — Emergency Department (HOSPITAL_COMMUNITY): Payer: Medicare Other

## 2014-11-07 ENCOUNTER — Encounter (HOSPITAL_COMMUNITY): Payer: Self-pay

## 2014-11-07 DIAGNOSIS — Z79899 Other long term (current) drug therapy: Secondary | ICD-10-CM | POA: Insufficient documentation

## 2014-11-07 DIAGNOSIS — R079 Chest pain, unspecified: Secondary | ICD-10-CM | POA: Diagnosis present

## 2014-11-07 DIAGNOSIS — I10 Essential (primary) hypertension: Secondary | ICD-10-CM | POA: Insufficient documentation

## 2014-11-07 DIAGNOSIS — K219 Gastro-esophageal reflux disease without esophagitis: Secondary | ICD-10-CM | POA: Diagnosis not present

## 2014-11-07 DIAGNOSIS — E785 Hyperlipidemia, unspecified: Secondary | ICD-10-CM | POA: Insufficient documentation

## 2014-11-07 DIAGNOSIS — R0789 Other chest pain: Secondary | ICD-10-CM

## 2014-11-07 DIAGNOSIS — Z7951 Long term (current) use of inhaled steroids: Secondary | ICD-10-CM | POA: Insufficient documentation

## 2014-11-07 DIAGNOSIS — G40909 Epilepsy, unspecified, not intractable, without status epilepticus: Secondary | ICD-10-CM | POA: Diagnosis not present

## 2014-11-07 DIAGNOSIS — F419 Anxiety disorder, unspecified: Secondary | ICD-10-CM | POA: Insufficient documentation

## 2014-11-07 DIAGNOSIS — J45909 Unspecified asthma, uncomplicated: Secondary | ICD-10-CM | POA: Diagnosis not present

## 2014-11-07 DIAGNOSIS — Z87891 Personal history of nicotine dependence: Secondary | ICD-10-CM | POA: Insufficient documentation

## 2014-11-07 LAB — BASIC METABOLIC PANEL
Anion gap: 8 (ref 5–15)
BUN: 9 mg/dL (ref 6–20)
CALCIUM: 9.2 mg/dL (ref 8.9–10.3)
CO2: 25 mmol/L (ref 22–32)
CREATININE: 0.67 mg/dL (ref 0.44–1.00)
Chloride: 107 mmol/L (ref 101–111)
GFR calc Af Amer: >60 mL/min (ref >60)
GFR calc non Af Amer: >60 mL/min (ref >60)
Glucose, Bld: 93 mg/dL (ref 65–99)
Potassium: 3.6 mmol/L (ref 3.5–5.1)
Sodium: 140 mmol/L (ref 135–145)

## 2014-11-07 LAB — CBC
HCT: 40.8 % (ref 36.0–46.0)
HEMOGLOBIN: 13.6 g/dL (ref 12.0–15.0)
MCH: 29 pg (ref 26.0–34.0)
MCHC: 33.3 g/dL (ref 30.0–36.0)
MCV: 87 fL (ref 78.0–100.0)
Platelets: 223 10*3/uL (ref 150–400)
RBC: 4.69 MIL/uL (ref 3.87–5.11)
RDW: 14.8 % (ref 11.5–15.5)
WBC: 7.5 10*3/uL (ref 4.0–10.5)

## 2014-11-07 LAB — I-STAT TROPONIN, ED: Troponin i, poc: 0 ng/mL (ref 0.00–0.08)

## 2014-11-07 MED ORDER — HYDROCODONE-HOMATROPINE 5-1.5 MG/5ML PO SYRP: 5.0000 mL | ORAL_SOLUTION | Freq: Four times a day (QID) | ORAL | Status: DC | PRN

## 2014-11-07 MED ORDER — NAPROXEN 375 MG PO TABS: 375.0000 mg | ORAL_TABLET | Freq: Two times a day (BID) | ORAL | Status: DC

## 2014-11-07 NOTE — ED Provider Notes (Signed)
CSN: [id number]     Arrival date & time 11/07/14  1354 History   First MD Initiated Contact with Patient 11/07/14 1613     Chief Complaint  Patient presents with  . Chest Pain     (Consider location/radiation/quality/duration/timing/severity/associated sxs/prior Treatment) HPI Comments: Patient presents to the emergency department for evaluation of chest pain. Patient started experiencing left-sided chest pain yesterday. Patient reports a sharp pain in the left upper chest that has been constant since it started. She denies injury. Pain worsens when she moves her left arm or coughs. She reports that she has had a cough for several weeks. She has been on 3 different antibiotic and prednisone for this. Cough is still present and at times she struggles to get some mucus up. She is not experiencing any shortness of breath currently.  Patient is a 59 y.o. female presenting with chest pain.  Chest Pain Associated symptoms: cough     Past Medical History  Diagnosis Date  . Hypertension   . Hyperlipemia   . GERD (gastroesophageal reflux disease)   . Anxiety   . Seizures   . Asthma   . GI problem     freq n/v   Past Surgical History  Procedure Laterality Date  . Cholecystectomy    . Abdominal hysterectomy    . Craniotomy  2014    [facility]  . Colonoscopy    . Upper gi endoscopy    . Hernia repair      umb  . Skin split graft Right 03/31/2014    Procedure: ADJACENT TISSUE TRANSFER RIGHT CHEEK;  Surgeon: Irene Limbo, MD;  Location: [place];  Service: Plastics;  Laterality: Right;   No family history on file. History  Substance Use Topics  . Smoking status: Former Smoker    Quit date: 03/30/1989  . Smokeless tobacco: Not on file  . Alcohol Use: No   OB History    No data available     Review of Systems  Respiratory: Positive for cough.   Cardiovascular: Positive for chest pain.  All other systems reviewed and are  negative.     Allergies  Review of patient's allergies indicates no known allergies.  Home Medications   Prior to Admission medications   Medication Sig Start Date End Date Taking? Authorizing Provider  albuterol (PROVENTIL HFA;VENTOLIN HFA) 108 (90 BASE) MCG/ACT inhaler Inhale 2 puffs into the lungs every 6 (six) hours as needed for wheezing or shortness of breath.    Historical Provider, MD  amLODipine (NORVASC) 5 MG tablet Take 5 mg by mouth daily.    Historical Provider, MD  benzonatate (TESSALON) 100 MG capsule Take 1 capsule (100 mg total) by mouth 2 (two) times daily as needed for cough. 05/16/14   Serita Grit, MD  budesonide-formoterol [facility]) 160-4.5 MCG/ACT inhaler Inhale 2 puffs into the lungs 2 (two) times daily.    Historical Provider, MD  cetirizine (ZYRTEC) 10 MG tablet Take 10 mg by mouth daily.    Historical Provider, MD  clonazePAM (KLONOPIN) 0.5 MG tablet Take 0.5 mg by mouth 2 (two) times daily.    Historical Provider, MD  Dextromethorphan-Guaifenesin (CORICIDIN HBP CONGESTION/COUGH PO) Take 1 Dose by mouth daily.    Historical Provider, MD  doxycycline (VIBRAMYCIN) 100 MG capsule Take 1 capsule (100 mg total) by mouth 2 (two) times daily. 05/16/14   Serita Grit, MD  esomeprazole (NEXIUM) 40 MG capsule Take 40 mg by mouth daily.    Historical Provider, MD  fluticasone (FLONASE) 50 MCG/ACT nasal spray Place 2 sprays into both nostrils daily.    Historical Provider, MD  HYDROcodone-acetaminophen (NORCO/VICODIN) 5-325 MG per tablet Take 1 tablet by mouth every 6 (six) hours as needed for moderate pain. 03/31/14   Irene Limbo, MD  levETIRAcetam (KEPPRA) 500 MG tablet Take 500 mg by mouth 2 (two) times daily.    Historical Provider, MD  lisinopril (PRINIVIL,ZESTRIL) 40 MG tablet Take 40 mg by mouth daily.    Historical Provider, MD  loperamide (IMODIUM) 2 MG capsule Take 1 capsule (2 mg total) by mouth 4 (four) times daily as needed for diarrhea or loose stools. 02/09/14    Kristen N Ward, DO  ondansetron (ZOFRAN ODT) 4 MG disintegrating tablet Take 1 tablet (4 mg total) by mouth every 8 (eight) hours as needed for nausea or vomiting. 02/09/14   Kristen N Ward, DO  OVER THE COUNTER MEDICATION Place 2 drops into both eyes 3 (three) times daily. "OTC [employer] Brand Redness Relief"    Historical Provider, MD  Potassium 99 MG TABS Take 99 mg by mouth daily as needed (for feeling weak).    Historical Provider, MD  simvastatin (ZOCOR) 40 MG tablet Take 40 mg by mouth daily.    Historical Provider, MD  Vitamin D, Ergocalciferol, (DRISDOL) 50000 UNITS CAPS capsule Take 50,000 Units by mouth every 7 (seven) days.    Historical Provider, MD  zolpidem (AMBIEN) 10 MG tablet Take 10 mg by mouth at bedtime.    Historical Provider, MD   BP 130/73 mmHg  Pulse 73  Temp(Src) 98.1 F (36.7 C) (Oral)  Resp 18  Ht 5\' 1"  (1.549 m)  SpO2 99% Physical Exam  Constitutional: She is oriented to person, place, and time. She appears well-developed and well-nourished. No distress.  HENT:  Head: Normocephalic and atraumatic.  Right Ear: Hearing normal.  Left Ear: Hearing normal.  Nose: Nose normal.  Mouth/Throat: Oropharynx is clear and moist and mucous membranes are normal.  Eyes: Conjunctivae and EOM are normal. Pupils are equal, round, and reactive to light.  Neck: Normal range of motion. Neck supple.  Cardiovascular: Regular rhythm, S1 normal and S2 normal.  Exam reveals no gallop and no friction rub.   No murmur heard. Pulmonary/Chest: Effort normal and breath sounds normal. No respiratory distress. She exhibits tenderness.    Abdominal: Soft. Normal appearance and bowel sounds are normal. There is no hepatosplenomegaly. There is no tenderness. There is no rebound, no guarding, no tenderness at McBurney's point and negative Murphy's sign. No hernia.  Musculoskeletal: Normal range of motion.  Neurological: She is alert and oriented to person, place, and time. She has normal  strength. No cranial nerve deficit or sensory deficit. Coordination normal. GCS eye subscore is 4. GCS verbal subscore is 5. GCS motor subscore is 6.  Skin: Skin is warm, dry and intact. No rash noted. No cyanosis.  Psychiatric: She has a normal mood and affect. Her speech is normal and behavior is normal. Thought content normal.  Nursing note and vitals reviewed.   ED Course  Procedures (including critical care time) Labs Review Labs Reviewed  CBC  BASIC METABOLIC PANEL  I-STAT [place], ED    Imaging Review Dg Chest 2 View  11/07/2014   CLINICAL DATA:  Left chest pain extending into the left arm. Tightness in the left shoulder. Cramping the fingers left hand.  EXAM: CHEST  2 VIEW  COMPARISON:  10/28/2014  FINDINGS: Mild enlargement of the cardiopericardial silhouette. No edema.  3 mm left upper lobe nodule, best seen on the frontal projection. Patient has a known 4 mm lingular nodule which is not well seen today.  No pleural effusion or adenopathy.  Mild thoracic spondylosis.  IMPRESSION: 1. Mild enlargement of the cardiopericardial silhouette, without edema. 2. Tiny left upper lobe pulmonary nodule, about 3 mm, stable from 08/12/2014.   Electronically Signed   By: Van Clines M.D.   On: 11/07/2014 15:18     EKG Interpretation   Date/Time:  Tuesday November 07 2014 14:07:31 EDT Ventricular Rate:  78 PR Interval:  170 QRS Duration: 80 QT Interval:  370 QTC Calculation: 421 R Axis:   50 Text Interpretation:  Normal sinus rhythm Low voltage QRS Borderline ECG  Otherwise within normal limits No significant change since last tracing  Confirmed by POLLINA  MD, CHRISTOPHER [phone]) on 11/07/2014 4:22:19 PM      MDM   Final diagnoses:  None   chest wall pain  Patient presents to the ER for evaluation of problems. Patient is here primarily for evaluation of chest pain. Patient has had continuous chest pain since yesterday which is extremely atypical. Pain is sharp in nature,  reproduced with palpation of the chest wall as well as moving of her left arm. Patient has a normal EKG and negative troponin after more than one full day of pain. This does not support a diagnosis of acute coronary syndrome or cardiac chest pain. Patient reassured, symptoms most likely pulmonary in origin as well as musculoskeletal. Chest x-ray did not show any evidence of pneumonia. Her lungs are clear and she is not hypoxic.    Orpah Greek, MD 11/07/14 [phone]

## 2014-11-07 NOTE — ED Notes (Signed)
NAD at this time. Pt is stable and going home with her friend.

## 2014-11-07 NOTE — Discharge Instructions (Signed)

## 2014-11-07 NOTE — ED Notes (Signed)
Pt here for cp that started bothering her yesterday on the left side. Denies any n/v with the cp yesterday or SOB. Pt has hx of hernia repair in feb, fatty knots on each side of neck and states her doctor says she is fine but she doesn't feel fine.

## 2015-08-23 ENCOUNTER — Emergency Department (HOSPITAL_BASED_OUTPATIENT_CLINIC_OR_DEPARTMENT_OTHER)
Admission: EM | Admit: 2015-08-23 | Discharge: 2015-08-23 | Disposition: A | Discharge status: Home / Self Care | Payer: Medicare Other | Attending: Emergency Medicine | Admitting: Emergency Medicine

## 2015-08-23 ENCOUNTER — Emergency Department (HOSPITAL_BASED_OUTPATIENT_CLINIC_OR_DEPARTMENT_OTHER): Payer: Medicare Other

## 2015-08-23 ENCOUNTER — Encounter (HOSPITAL_BASED_OUTPATIENT_CLINIC_OR_DEPARTMENT_OTHER): Payer: Self-pay

## 2015-08-23 DIAGNOSIS — J45909 Unspecified asthma, uncomplicated: Secondary | ICD-10-CM | POA: Insufficient documentation

## 2015-08-23 DIAGNOSIS — E785 Hyperlipidemia, unspecified: Secondary | ICD-10-CM | POA: Diagnosis not present

## 2015-08-23 DIAGNOSIS — Z87891 Personal history of nicotine dependence: Secondary | ICD-10-CM | POA: Insufficient documentation

## 2015-08-23 DIAGNOSIS — R5383 Other fatigue: Secondary | ICD-10-CM | POA: Diagnosis present

## 2015-08-23 DIAGNOSIS — J069 Acute upper respiratory infection, unspecified: Secondary | ICD-10-CM | POA: Diagnosis not present

## 2015-08-23 DIAGNOSIS — I10 Essential (primary) hypertension: Secondary | ICD-10-CM | POA: Diagnosis not present

## 2015-08-23 LAB — CBC WITH DIFFERENTIAL/PLATELET
Basophils Absolute: 0 10*3/uL (ref 0.0–0.1)
Basophils Relative: 0 %
Eosinophils Absolute: 0.2 10*3/uL (ref 0.0–0.7)
Eosinophils Relative: 2 %
HCT: 39.5 % (ref 36.0–46.0)
HEMOGLOBIN: 13.1 g/dL (ref 12.0–15.0)
LYMPHS ABS: 2.1 10*3/uL (ref 0.7–4.0)
Lymphocytes Relative: 26 %
MCH: 29.6 pg (ref 26.0–34.0)
MCHC: 33.2 g/dL (ref 30.0–36.0)
MCV: 89.4 fL (ref 78.0–100.0)
Monocytes Absolute: 0.9 10*3/uL (ref 0.1–1.0)
Monocytes Relative: 11 %
NEUTROS ABS: 4.8 10*3/uL (ref 1.7–7.7)
Neutrophils Relative %: 61 %
Platelets: 257 10*3/uL (ref 150–400)
RBC: 4.42 MIL/uL (ref 3.87–5.11)
RDW: 14.3 % (ref 11.5–15.5)
WBC: 7.9 10*3/uL (ref 4.0–10.5)

## 2015-08-23 LAB — BASIC METABOLIC PANEL
Anion gap: 8 (ref 5–15)
BUN: 12 mg/dL (ref 6–20)
CHLORIDE: 107 mmol/L (ref 101–111)
CO2: 25 mmol/L (ref 22–32)
Calcium: 9.3 mg/dL (ref 8.9–10.3)
Creatinine, Ser: 0.79 mg/dL (ref 0.44–1.00)
GFR calc Af Amer: >60 mL/min (ref >60)
GFR calc non Af Amer: >60 mL/min (ref >60)
GLUCOSE: 132 mg/dL — AB (ref 65–99)
Potassium: 3.5 mmol/L (ref 3.5–5.1)
Sodium: 140 mmol/L (ref 135–145)

## 2015-08-23 MED ORDER — NAPROXEN 250 MG PO TABS
500.0000 mg | ORAL_TABLET | Freq: Once | ORAL | Status: AC
  Administered 2015-08-23: 500 mg via ORAL
  Filled 2015-08-23: qty 2

## 2015-08-23 MED ORDER — ACETAMINOPHEN 500 MG PO TABS
1000.0000 mg | ORAL_TABLET | Freq: Once | ORAL | Status: AC
  Administered 2015-08-23: 1000 mg via ORAL
  Filled 2015-08-23: qty 2

## 2015-08-23 NOTE — ED Notes (Signed)
Patient transported to X-ray 

## 2015-08-23 NOTE — Discharge Instructions (Signed)

## 2015-08-23 NOTE — ED Notes (Signed)
C/o fatigue, prod cough x 4 days-NAD-steady gait

## 2015-08-23 NOTE — ED Provider Notes (Signed)
CSN: [id number]     Arrival date & time 08/23/15  81 History   First MD Initiated Contact with Patient 08/23/15 1606     Chief Complaint  Patient presents with  . Fatigue     (Consider location/radiation/quality/duration/timing/severity/associated sxs/prior Treatment) Patient is a 60 y.o. female presenting with general illness. The history is provided by the patient.  Illness Severity:  Moderate Onset quality:  Sudden Duration:  2 days Timing:  Constant Progression:  Worsening Chronicity:  New Associated symptoms: congestion and cough   Associated symptoms: no chest pain, no fever, no headaches, no myalgias, no nausea, no rhinorrhea, no shortness of breath, no vomiting and no wheezing    60 yo F With a chief complaints of fatigue. Patient has had cough and congestion going on for about 2 days. Some generalized malaise. Denies fevers or chills denies shortness breath. Patient has been to the store very often so she thinks she might have gotten sick that way. 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   Past Medical History  Diagnosis Date  . Hypertension   . Hyperlipemia   . GERD (gastroesophageal reflux disease)   . Anxiety   . Seizures ([place])   . Asthma   . GI problem     freq n/v   Past Surgical History  Procedure Laterality Date  . Cholecystectomy    . Abdominal hysterectomy    . Craniotomy  2014    [facility]-frontal lobe  . Colonoscopy    . Upper gi endoscopy    . Hernia repair      umb  . Skin split graft Right 03/31/2014    Procedure: ADJACENT TISSUE TRANSFER RIGHT CHEEK;  Surgeon: Irene Limbo, MD;  Location: [facility];  Service: Plastics;  Laterality: Right;   No family history on file. Social History  Substance Use Topics  . Smoking status: Former Smoker    Quit date: 03/30/1989  . Smokeless tobacco: None  . Alcohol Use: No   OB History    No data  available     Review of Systems  Constitutional: Negative for fever and chills.  HENT: Positive for congestion. Negative for rhinorrhea.   Eyes: Negative for redness and visual disturbance.  Respiratory: Positive for cough. Negative for shortness of breath and wheezing.   Cardiovascular: Negative for chest pain and palpitations.  Gastrointestinal: Negative for nausea and vomiting.  Genitourinary: Negative for dysuria and urgency.  Musculoskeletal: Negative for myalgias and arthralgias.  Skin: Negative for pallor and wound.  Neurological: Positive for weakness. Negative for dizziness and headaches.      Allergies  Review of patient's allergies indicates no known allergies.  Home Medications   Prior to Admission medications   Medication Sig Start Date End Date Taking? Authorizing Provider  albuterol (PROVENTIL HFA;VENTOLIN HFA) 108 (90 BASE) MCG/ACT inhaler Inhale 2 puffs into the lungs every 6 (six) hours as needed for wheezing or shortness of breath.    Historical Provider, MD  amLODipine (NORVASC) 5 MG tablet Take 5 mg by mouth daily.    Historical Provider, MD  cetirizine (ZYRTEC) 10 MG tablet Take 10 mg by mouth daily.    Historical Provider, MD  clonazePAM (KLONOPIN) 0.5 MG tablet Take 0.5 mg by mouth 2 (two) times daily.    Historical Provider, MD  esomeprazole (NEXIUM) 40 MG capsule Take 40 mg by mouth daily.  Historical Provider, MD  fluticasone (FLONASE) 50 MCG/ACT nasal spray Place 2 sprays into both nostrils daily.    Historical Provider, MD  levETIRAcetam (KEPPRA) 500 MG tablet Take 500 mg by mouth 2 (two) times daily.    Historical Provider, MD  lisinopril (PRINIVIL,ZESTRIL) 40 MG tablet Take 40 mg by mouth daily.    Historical Provider, MD  OVER THE COUNTER MEDICATION Place 2 drops into both eyes 3 (three) times daily. "OTC [employer] Brand Redness Relief"    Historical Provider, MD  Potassium 99 MG TABS Take 99 mg by mouth daily as needed (for feeling weak).     Historical Provider, MD  simvastatin (ZOCOR) 40 MG tablet Take 40 mg by mouth daily.    Historical Provider, MD  Vitamin D, Ergocalciferol, (DRISDOL) 50000 UNITS CAPS capsule Take 50,000 Units by mouth every 7 (seven) days.    Historical Provider, MD  zolpidem (AMBIEN) 10 MG tablet Take 10 mg by mouth at bedtime.    Historical Provider, MD   BP 156/77 mmHg  Pulse 86  Temp(Src) 97.9 F (36.6 C) (Oral)  Resp 20  Ht 5\' 1"  (1.549 m)  Wt 199 lb (90.266 kg)  BMI 37.62 kg/m2  SpO2 98% Physical Exam  Constitutional: She is oriented to person, place, and time. She appears well-developed and well-nourished. No distress.  HENT:  Head: Normocephalic and atraumatic.  Eyes: EOM are normal. Pupils are equal, round, and reactive to light.  Neck: Normal range of motion. Neck supple.  Cardiovascular: Normal rate and regular rhythm.  Exam reveals no gallop and no friction rub.   No murmur heard. Pulmonary/Chest: Effort normal. She has no wheezes. She has no rales.  Abdominal: Soft. She exhibits no distension. There is no tenderness. There is no rebound.  Musculoskeletal: She exhibits no edema or tenderness.  Neurological: She is alert and oriented to person, place, and time.  Skin: Skin is warm and dry. She is not diaphoretic.  Psychiatric: She has a normal mood and affect. Her behavior is normal.  Nursing note and vitals reviewed.   ED Course  Procedures (including critical care time) Labs Review Labs Reviewed  BASIC METABOLIC PANEL - Abnormal; Notable for the following:    Glucose, Bld 132 (*)    All other components within normal limits  CBC WITH DIFFERENTIAL/PLATELET    Imaging Review Dg Chest 2 View  08/23/2015  CLINICAL DATA:  Fatigue, coughs and sinus drainage x 4 days. HX: HTN, Former smoker, asthma EXAM: CHEST  2 VIEW COMPARISON:  05/08/2015 FINDINGS: A mild pectus excavatum deformity. Moderate thoracic spondylosis. Midline trachea. Normal heart size and mediastinal contours. No  pleural effusion or pneumothorax. Mild volume loss in the medial lung bases bilaterally. Similar. Lungs are otherwise clear. IMPRESSION: No acute cardiopulmonary disease. Electronically Signed   By: Abigail Miyamoto M.D.   On: 08/23/2015 16:13   I have personally reviewed and evaluated these images and lab results as part of my medical decision-making.   EKG Interpretation None      MDM   Final diagnoses:  URI (upper respiratory infection)    60 yo F with a chief complaint of fatigue cough and congestion. Likely has a URI. Patient requesting lab so that she can figure out if her potassium is low.  Labs unremarkable.     I have discussed the diagnosis/risks/treatment options with the patient and family and believe the pt to be eligible for discharge home to follow-up with PCP. We also discussed returning to the  ED immediately if new or worsening sx occur. We discussed the sx which are most concerning (e.g., sudden worsening pain, fever, inability to tolerate by mouth) that necessitate immediate return. Medications administered to the patient during their visit and any new prescriptions provided to the patient are listed below.  Medications given during this visit Medications  acetaminophen (TYLENOL) tablet 1,000 mg (1,000 mg Oral Given 08/23/15 1702)  naproxen (NAPROSYN) tablet 500 mg (500 mg Oral Given 08/23/15 1700)    Discharge Medication List as of 08/23/2015  5:12 PM      The patient appears reasonably screen and/or stabilized for discharge and I doubt any other medical condition or other [facility] requiring further screening, evaluation, or treatment in the ED at this time prior to discharge.    Deno Etienne, DO 08/24/15 0006

## 2015-08-23 NOTE — ED Notes (Signed)
MD at bedside. 

## 2016-12-13 ENCOUNTER — Emergency Department (HOSPITAL_BASED_OUTPATIENT_CLINIC_OR_DEPARTMENT_OTHER)
Admission: EM | Admit: 2016-12-13 | Discharge: 2016-12-13 | Disposition: A | Discharge status: Home / Self Care | Payer: Medicare Other | Attending: Emergency Medicine | Admitting: Emergency Medicine

## 2016-12-13 ENCOUNTER — Encounter (HOSPITAL_BASED_OUTPATIENT_CLINIC_OR_DEPARTMENT_OTHER): Payer: Self-pay | Admitting: Emergency Medicine

## 2016-12-13 DIAGNOSIS — Z79899 Other long term (current) drug therapy: Secondary | ICD-10-CM | POA: Insufficient documentation

## 2016-12-13 DIAGNOSIS — Z87891 Personal history of nicotine dependence: Secondary | ICD-10-CM | POA: Diagnosis not present

## 2016-12-13 DIAGNOSIS — J45909 Unspecified asthma, uncomplicated: Secondary | ICD-10-CM | POA: Insufficient documentation

## 2016-12-13 DIAGNOSIS — I10 Essential (primary) hypertension: Secondary | ICD-10-CM | POA: Insufficient documentation

## 2016-12-13 DIAGNOSIS — J029 Acute pharyngitis, unspecified: Secondary | ICD-10-CM

## 2016-12-13 DIAGNOSIS — J358 Other chronic diseases of tonsils and adenoids: Secondary | ICD-10-CM | POA: Insufficient documentation

## 2016-12-13 LAB — RAPID STREP SCREEN (MED CTR MEBANE ONLY): Streptococcus, Group A Screen (Direct): NEGATIVE

## 2016-12-13 MED ORDER — DEXAMETHASONE SODIUM PHOSPHATE 10 MG/ML IJ SOLN
10.0000 mg | Freq: Once | INTRAMUSCULAR | Status: DC
  Filled 2016-12-13: qty 1

## 2016-12-13 MED ORDER — DEXAMETHASONE SODIUM PHOSPHATE 10 MG/ML IJ SOLN
10.0000 mg | Freq: Once | INTRAMUSCULAR | Status: AC
  Administered 2016-12-13: 10 mg via INTRAMUSCULAR

## 2016-12-13 MED ORDER — AMOXICILLIN-POT CLAVULANATE 875-125 MG PO TABS: 1.0000 | ORAL_TABLET | Freq: Two times a day (BID) | ORAL | 0 refills | Status: DC

## 2016-12-13 NOTE — Discharge Instructions (Signed)
You were seen today for sore throat. You have a tonsillith. You also has some evidence of tonsillitis. You'll be discharged home on antibiotics. If you continue to have pain, you may need to follow-up with ENT. If you develop difficulty swallowing, fevers, or any new or worsening symptoms she should be reevaluated.

## 2016-12-13 NOTE — ED Provider Notes (Signed)
[facility] DEPT [facility] Provider Note   CSN: [id number] Arrival date & time: 12/13/16  1905  By signing my name below, I, Ephriam Jenkins, attest that this documentation has been prepared under the direction and in the presence of No att. providers found. Electronically signed, Ephriam Jenkins, ED Scribe. 12/13/16. 8:07 PM.  History   Chief Complaint Chief Complaint  Patient presents with  . Sore Throat    HPI HPI Comments: Monica Booth is a 61 y.o. female who presents to the Emergency Department complaining of intermittent sore throat that started for approximately two weeks ago. Today pt had worsening, 7/10 pain described as "pins and needles" to the back of her throat. Pt's pain is localized to the left side of her throat. Pt notes recent rhinorrhea within the past week, took allergy medication this morning which relieved her symptoms. She also notes intermittent fullness in both of her ears recently but denies any pain. No fever, cough, chest pain, shortness of breath.  The history is provided by the patient. No language interpreter was used.    Past Medical History:  Diagnosis Date  . Anxiety   . Asthma   . GERD (gastroesophageal reflux disease)   . GI problem    freq n/v  . Hyperlipemia   . Hypertension   . Seizures ([place])     There are no active problems to display for this patient.   Past Surgical History:  Procedure Laterality Date  . ABDOMINAL HYSTERECTOMY    . CHOLECYSTECTOMY    . COLONOSCOPY    . CRANIOTOMY  2014   [facility]-frontal lobe  . HERNIA REPAIR     umb  . SKIN SPLIT GRAFT Right 03/31/2014   Procedure: ADJACENT TISSUE TRANSFER RIGHT CHEEK;  Surgeon: Irene Limbo, MD;  Location: [facility];  Service: Plastics;  Laterality: Right;  . UPPER GI ENDOSCOPY      OB History    No data available      Home Medications    Prior to Admission medications   Medication Sig Start Date End Date Taking? Authorizing Provider  albuterol (PROVENTIL  HFA;VENTOLIN HFA) 108 (90 BASE) MCG/ACT inhaler Inhale 2 puffs into the lungs every 6 (six) hours as needed for wheezing or shortness of breath.    [provider]  amLODipine (NORVASC) 5 MG tablet Take 5 mg by mouth daily.    [provider]  amoxicillin-clavulanate (AUGMENTIN) 875-125 MG tablet Take 1 tablet by mouth every 12 (twelve) hours. 12/13/16   Gloriana Piltz, Barbette Hair, MD  cetirizine (ZYRTEC) 10 MG tablet Take 10 mg by mouth daily.    [provider]  clonazePAM (KLONOPIN) 0.5 MG tablet Take 0.5 mg by mouth 2 (two) times daily.    [provider]  esomeprazole (NEXIUM) 40 MG capsule Take 40 mg by mouth daily.    [provider]  fluticasone (FLONASE) 50 MCG/ACT nasal spray Place 2 sprays into both nostrils daily.    [provider]  levETIRAcetam (KEPPRA) 500 MG tablet Take 500 mg by mouth 2 (two) times daily.    [provider]  lisinopril (PRINIVIL,ZESTRIL) 40 MG tablet Take 40 mg by mouth daily.    [provider]  OVER THE COUNTER MEDICATION Place 2 drops into both eyes 3 (three) times daily. "OTC [employer] Brand Redness Relief"    [provider]  Potassium 99 MG TABS Take 99 mg by mouth daily as needed (for feeling weak).    [provider]  simvastatin (  ZOCOR) 40 MG tablet Take 40 mg by mouth daily.    [provider]  Vitamin D, Ergocalciferol, (DRISDOL) 50000 UNITS CAPS capsule Take 50,000 Units by mouth every 7 (seven) days.    [provider]  zolpidem (AMBIEN) 10 MG tablet Take 10 mg by mouth at bedtime.    [provider]    Family History History reviewed. No pertinent family history.  Social History Social History  Substance Use Topics  . Smoking status: Former Smoker    Quit date: 03/30/1989  . Smokeless tobacco: Never Used  . Alcohol use No     Allergies   Patient has no known allergies.   Review of Systems Review of Systems  Constitutional:  Negative for fever.  HENT: Positive for rhinorrhea (resolved) and sore throat.   Respiratory: Negative for cough and shortness of breath.   Cardiovascular: Negative for chest pain.  Gastrointestinal: Negative for abdominal pain.  All other systems reviewed and are negative.    Physical Exam Updated Vital Signs BP (!) 149/64 (BP Location: Right Arm)   Pulse 73   Temp 98.5 F (36.9 C) (Oral)   Resp 19   Ht 5\' 1"  (1.549 m)   Wt 81.6 kg (180 lb)   SpO2 100%   BMI 34.01 kg/m   Physical Exam  Constitutional: She is oriented to person, place, and time. She appears well-developed and well-nourished. No distress.  HENT:  Head: Normocephalic and atraumatic.  Bilateral tonsillar enlargement with mild erythema, tonsillith noted with crater over left tonsil, uvula midline  Cardiovascular: Normal rate and regular rhythm.  Exam reveals no friction rub.   No murmur heard. Pulmonary/Chest: Effort normal and breath sounds normal. No respiratory distress.  Lymphadenopathy:    She has no cervical adenopathy.  Neurological: She is alert and oriented to person, place, and time.  Skin: Skin is warm and dry. She is not diaphoretic.  Psychiatric: She has a normal mood and affect. Judgment normal.  Nursing note and vitals reviewed.    ED Treatments / Results  DIAGNOSTIC STUDIES: Oxygen Saturation is 100% on RA, normal by my interpretation.  COORDINATION OF CARE: 7:32 PM-Discussed treatment plan with pt at bedside and pt agreed to plan.   Labs (all labs ordered are listed, but only abnormal results are displayed) Labs Reviewed  RAPID STREP SCREEN (NOT AT [facility])  CULTURE, GROUP A STREP [facility])    EKG  EKG Interpretation None       Radiology No results found.  Procedures Procedures (including critical care time)  Medications Ordered in ED Medications  dexamethasone (DECADRON) injection 10 mg (10 mg Intramuscular Given 12/13/16 2020)     Initial Impression / Assessment and Plan  / ED Course  I have reviewed the triage vital signs and the nursing notes.  Pertinent labs & imaging results that were available during my care of the patient were reviewed by me and considered in my medical decision making (see chart for details).     Patient presents with sore throat. She is nontoxic on exam. Afebrile. Mild erythema and swelling to the bilateral tonsils with a tonsillith on the left. ABCs intact. Strep screen negative. Suspect tonsillitis. Patient given Decadron for swelling. Will discharge home with Augmentin. Recommend salt water gargles for tonsillith. ENT follow-up provided.  After history, exam, and medical workup I feel the patient has been appropriately medically screened and is safe for discharge home. Pertinent diagnoses were discussed with the patient. Patient was given return precautions.  Final Clinical Impressions(s) / ED Diagnoses   Final diagnoses:  Sore throat  Tonsillith    New Prescriptions Discharge Medication List as of 12/13/2016  8:44 PM    START taking these medications   Details  amoxicillin-clavulanate (AUGMENTIN) 875-125 MG tablet Take 1 tablet by mouth every 12 (twelve) hours., Starting Sat 12/13/2016, Print       I personally performed the services described in this documentation, which was scribed in my presence. The recorded information has been reviewed and is accurate.     Merryl Hacker, MD 12/13/16 2117

## 2016-12-13 NOTE — ED Notes (Signed)
Alert, NAD, calm, interactive, resps e/u, speaking in clear complete sentences, no dyspnea noted, skin W&D, VSS, c/o sore throat, cough, congestion, ear popping, dizziness and sob, (denies:fever, NVD).

## 2016-12-13 NOTE — ED Notes (Signed)
EDP into room, prior to RN assessment, see MD notes, pending orders.   

## 2016-12-13 NOTE — ED Triage Notes (Signed)
Patient reports that she has had a sore throat x 2 weeks. Last night  Worsened.

## 2016-12-16 LAB — CULTURE, GROUP A STREP (THRC)

## 2017-10-12 ENCOUNTER — Other Ambulatory Visit: Payer: Self-pay

## 2017-10-12 ENCOUNTER — Emergency Department (HOSPITAL_BASED_OUTPATIENT_CLINIC_OR_DEPARTMENT_OTHER)
Admission: EM | Admit: 2017-10-12 | Discharge: 2017-10-13 | Disposition: A | Discharge status: Home / Self Care | Payer: 59 | Attending: Emergency Medicine | Admitting: Emergency Medicine

## 2017-10-12 ENCOUNTER — Encounter (HOSPITAL_BASED_OUTPATIENT_CLINIC_OR_DEPARTMENT_OTHER): Payer: Self-pay | Admitting: *Deleted

## 2017-10-12 DIAGNOSIS — I10 Essential (primary) hypertension: Secondary | ICD-10-CM | POA: Insufficient documentation

## 2017-10-12 DIAGNOSIS — Z79899 Other long term (current) drug therapy: Secondary | ICD-10-CM | POA: Diagnosis not present

## 2017-10-12 DIAGNOSIS — Z87891 Personal history of nicotine dependence: Secondary | ICD-10-CM | POA: Insufficient documentation

## 2017-10-12 DIAGNOSIS — J45909 Unspecified asthma, uncomplicated: Secondary | ICD-10-CM | POA: Insufficient documentation

## 2017-10-12 DIAGNOSIS — R197 Diarrhea, unspecified: Secondary | ICD-10-CM | POA: Diagnosis not present

## 2017-10-12 DIAGNOSIS — R112 Nausea with vomiting, unspecified: Secondary | ICD-10-CM | POA: Diagnosis not present

## 2017-10-12 LAB — CBC
HCT: 40.6 % (ref 36.0–46.0)
Hemoglobin: 13.6 g/dL (ref 12.0–15.0)
MCH: 30.1 pg (ref 26.0–34.0)
MCHC: 33.5 g/dL (ref 30.0–36.0)
MCV: 89.8 fL (ref 78.0–100.0)
Platelets: 253 10*3/uL (ref 150–400)
RBC: 4.52 MIL/uL (ref 3.87–5.11)
RDW: 13.4 % (ref 11.5–15.5)
WBC: 16.2 10*3/uL — ABNORMAL HIGH (ref 4.0–10.5)

## 2017-10-12 LAB — COMPREHENSIVE METABOLIC PANEL
ALT: 17 U/L (ref 14–54)
AST: 21 U/L (ref 15–41)
Albumin: 4.5 g/dL (ref 3.5–5.0)
Alkaline Phosphatase: 64 U/L (ref 38–126)
Anion gap: 7 (ref 5–15)
BILIRUBIN TOTAL: 0.3 mg/dL (ref 0.3–1.2)
BUN: 13 mg/dL (ref 6–20)
CO2: 24 mmol/L (ref 22–32)
CREATININE: 0.66 mg/dL (ref 0.44–1.00)
Calcium: 8.8 mg/dL — ABNORMAL LOW (ref 8.9–10.3)
Chloride: 107 mmol/L (ref 101–111)
Glucose, Bld: 134 mg/dL — ABNORMAL HIGH (ref 65–99)
Potassium: 3.6 mmol/L (ref 3.5–5.1)
Sodium: 138 mmol/L (ref 135–145)
TOTAL PROTEIN: 7.3 g/dL (ref 6.5–8.1)

## 2017-10-12 LAB — LIPASE, BLOOD: Lipase: 33 U/L (ref 11–51)

## 2017-10-12 LAB — TROPONIN I: Troponin I: <0.03 ng/mL (ref <0.03)

## 2017-10-12 MED ORDER — SODIUM CHLORIDE 0.9 % IV BOLUS
1000.0000 mL | Freq: Once | INTRAVENOUS | Status: AC
  Administered 2017-10-12: 1000 mL via INTRAVENOUS

## 2017-10-12 MED ORDER — ONDANSETRON HCL 4 MG/2ML IJ SOLN
4.0000 mg | Freq: Once | INTRAMUSCULAR | Status: AC
  Administered 2017-10-12: 4 mg via INTRAVENOUS
  Filled 2017-10-12: qty 2

## 2017-10-12 NOTE — ED Triage Notes (Addendum)
Vomiting and diarrhea this evening. She has also been having pain in her right elbow, and a burning pain in the center of her chest.

## 2017-10-12 NOTE — ED Provider Notes (Addendum)
[facility] EMERGENCY DEPARTMENT Provider Note   CSN: [id number] Arrival date & time: 10/12/17  2152     History   Chi, ef Complaint Chief Complaint  Patient presents with  . Emesis  . Diarrhea    HPI Monica Booth is a 62 y.o. female.  Patient with history of seizure disorder on Keppra, GERD --presents with acute onset of nausea, vomiting, and diarrhea after eating [place] chicken today.  Patient had multiple episodes of nonbloody, nonbilious emesis and nonbloody watery stools.  She had associated burning in the middle of her chest with vomiting.  She denies any shortness of breath.  She does not have any focal abdominal pain.  No urinary symptoms.  No treatments prior to arrival.  She does not know if anybody else who had the chicken is also sick.  No known sick contacts.  She has a history of several abdominal surgeries including cholecystectomy and hernia surgery.   Patient also complains of intermittent pain in her right elbow without swelling.  No injury reported.     Past Medical History:  Diagnosis Date  . Anxiety   . Asthma   . GERD (gastroesophageal reflux disease)   . GI problem    freq n/v  . Hyperlipemia   . Hypertension   . Seizures ([place])     There are no active problems to display for this patient.   Past Surgical History:  Procedure Laterality Date  . ABDOMINAL HYSTERECTOMY    . CHOLECYSTECTOMY    . COLONOSCOPY    . CRANIOTOMY  2014   [facility]  . HERNIA REPAIR     umb  . SKIN SPLIT GRAFT Right 03/31/2014   Procedure: ADJACENT TISSUE TRANSFER RIGHT CHEEK;  Surgeon: Irene Limbo, MD;  Location: [facility];  Service: Plastics;  Laterality: Right;  . UPPER GI ENDOSCOPY       OB History   None      Home Medications    Prior to Admission medications   Medication Sig Start Date End Date Taking? Authorizing Provider  albuterol (PROVENTIL HFA;VENTOLIN HFA) 108 (90 BASE) MCG/ACT inhaler Inhale 2 puffs  into the lungs every 6 (six) hours as needed for wheezing or shortness of breath.    [provider]  amLODipine (NORVASC) 5 MG tablet Take 5 mg by mouth daily.    [provider]  amoxicillin-clavulanate (AUGMENTIN) 875-125 MG tablet Take 1 tablet by mouth every 12 (twelve) hours. 12/13/16   Horton, Barbette Hair, MD  cetirizine (ZYRTEC) 10 MG tablet Take 10 mg by mouth daily.    [provider]  clonazePAM (KLONOPIN) 0.5 MG tablet Take 0.5 mg by mouth 2 (two) times daily.    [provider]  esomeprazole (NEXIUM) 40 MG capsule Take 40 mg by mouth daily.    [provider]  fluticasone (FLONASE) 50 MCG/ACT nasal spray Place 2 sprays into both nostrils daily.    [provider]  levETIRAcetam (KEPPRA) 500 MG tablet Take 500 mg by mouth 2 (two) times daily.    [provider]  lisinopril (PRINIVIL,ZESTRIL) 40 MG tablet Take 40 mg by mouth daily.    [provider]  OVER THE COUNTER MEDICATION Place 2 drops into both eyes 3 (three) times daily. "OTC [employer] Brand Redness Relief"    [provider]  Potassium 99 MG TABS Take 99 mg by mouth daily as needed (for feeling weak).    [provider]  simvastatin (ZOCOR) 40  MG tablet Take 40 mg by mouth daily.    [provider]  Vitamin D, Ergocalciferol, (DRISDOL) 50000 UNITS CAPS capsule Take 50,000 Units by mouth every 7 (seven) days.    [provider]  zolpidem (AMBIEN) 10 MG tablet Take 10 mg by mouth at bedtime.    [provider]    Family History No family history on file.  Social History Social History   Tobacco Use  . Smoking status: Former Smoker    Last attempt to quit: 03/30/1989    Years since quitting: 28.5  . Smokeless tobacco: Never Used  Substance Use Topics  . Alcohol use: No  . Drug use: No     Allergies   Patient has no known allergies.   Review of Systems Review of Systems  Constitutional:  Negative for appetite change and fever.  HENT: Negative for rhinorrhea and sore throat.   Eyes: Negative for redness.  Respiratory: Negative for cough.   Cardiovascular: Positive for chest pain (burning).  Gastrointestinal: Positive for diarrhea, nausea and vomiting. Negative for abdominal pain and blood in stool.       Negative for hematemesis  Genitourinary: Negative for dysuria.  Musculoskeletal: Positive for arthralgias. Negative for myalgias.  Skin: Negative for rash.  Neurological: Negative for light-headedness.     Physical Exam Updated Vital Signs BP (!) 180/77   Pulse 99   Temp 97.8 F (36.6 C) (Oral)   Resp 18   Ht 5\' 1"  (1.549 m)   Wt 81.6 kg (180 lb)   SpO2 98%   BMI 34.01 kg/m   Physical Exam  Constitutional: She appears well-developed and well-nourished.  HENT:  Head: Normocephalic and atraumatic.  Mouth/Throat: Oropharynx is clear and moist.  Eyes: Conjunctivae are normal. Right eye exhibits no discharge. Left eye exhibits no discharge.  Neck: Normal range of motion. Neck supple.  Cardiovascular: Normal rate, regular rhythm and normal heart sounds.  Pulmonary/Chest: Effort normal and breath sounds normal. No respiratory distress.  Abdominal: Soft. There is tenderness. There is no rebound and no guarding.  Mild generalized tenderness.  Musculoskeletal:       Right elbow: Normal.She exhibits normal range of motion, no swelling and no effusion.  Neurological: She is alert.  Skin: Skin is warm and dry.  Psychiatric: She has a normal mood and affect.  Nursing note and vitals reviewed.    ED Treatments / Results  Labs (all labs ordered are listed, but only abnormal results are displayed) Labs Reviewed  COMPREHENSIVE METABOLIC PANEL - Abnormal; Notable for the following components:      Result Value   Glucose, Bld 134 (*)    Calcium 8.8 (*)    All other components within normal limits  CBC - Abnormal; Notable for the following components:   WBC 16.2  (*)    All other components within normal limits  LIPASE, BLOOD  TROPONIN I    EKG EKG Interpretation  Date/Time:  Monday October 12 2017 22:05:12 EDT Ventricular Rate:  98 PR Interval:  168 QRS Duration: 82 QT Interval:  340 QTC Calculation: 434 R Axis:   48 Text Interpretation:  Normal sinus rhythm Nonspecific ST abnormality Abnormal ECG No STEMI.  Confirmed by Nanda Quinton [phone]) on 10/12/2017 10:10:10 PM   Radiology No results found.  Procedures Procedures (including critical care time)  Medications Ordered in ED Medications  sodium chloride 0.9 % bolus 1,000 mL (1,000 mLs Intravenous New Bag/Given 10/12/17 2326)  ondansetron (ZOFRAN) injection 4 mg (4 mg  Intravenous Given 10/12/17 2326)     Initial Impression / Assessment and Plan / ED Course  I have reviewed the triage vital signs and the nursing notes.  Pertinent labs & imaging results that were available during my care of the patient were reviewed by me and considered in my medical decision making (see chart for details).     Patient seen and examined.  Fluids, Zofran ordered.  Will check labs. EKG reviewed by myself. No significant findings.   Vital signs reviewed and are as follows: BP (!) 180/77   Pulse 99   Temp 97.8 F (36.6 C) (Oral)   Resp 18   Ht 5\' 1"  (1.549 m)   Wt 81.6 kg (180 lb)   SpO2 98%   BMI 34.01 kg/m   12:08 AM patient states that she is feeling a bit better.  No p.o. fluid challenge yet. Abdominal exam unchanged. UA cancelled as patient not having any new or changing urinary sx.   Anticipate d/c to home with zofran after PO challenge.  Discussed clear liquid diet and brat diet.  The patient was urged to return to the Emergency Department immediately with worsening of current symptoms, worsening abdominal pain, persistent vomiting, blood noted in stools, fever, or any other concerns. The patient verbalized understanding.   12:27 AM Patient tolerating water. Still has some burning, likely  from previous vomiting. Will give GI cocktail prior to discharge.    Final Clinical Impressions(s) / ED Diagnoses   Final diagnoses:  Nausea vomiting and diarrhea   Patient with N/V/D. Vitals are stable, no fever. Labs with leukocytosis otherwise unremarkable. Imaging not felt necessary at this time as there is no focal abd pain. No signs of dehydration, patient is tolerating PO's in ED. Lungs are clear and no signs suggestive of PNA. Doubt SBO in patient with previous abdominal surgeries. Low concern for appendicitis, cholecystitis, pancreatitis, ruptured viscus, UTI, kidney stone, aortic dissection, aortic aneurysm or other emergent abdominal etiology. Mild burning pain in chest since vomiting began. Doubt ACS. EKG and troponin without signs of ischemia. Symptoms atypical for ACS/dissection. Supportive therapy indicated with return if symptoms worsen.    ED Discharge Orders        Ordered    ondansetron (ZOFRAN ODT) 4 MG disintegrating tablet  Every 8 hours PRN     10/13/17 0010       Carlisle Cater, PA-C 10/13/17 0029    Long, Wonda Olds, MD 10/13/17 1004

## 2017-10-13 DIAGNOSIS — R112 Nausea with vomiting, unspecified: Secondary | ICD-10-CM | POA: Diagnosis not present

## 2017-10-13 MED ORDER — ONDANSETRON 4 MG PO TBDP: 4.0000 mg | ORAL_TABLET | Freq: Three times a day (TID) | ORAL | 0 refills | Status: AC | PRN

## 2017-10-13 MED ORDER — GI COCKTAIL ~~LOC~~
30.0000 mL | Freq: Once | ORAL | Status: AC
  Administered 2017-10-13: 30 mL via ORAL
  Filled 2017-10-13: qty 30

## 2017-10-13 NOTE — Discharge Instructions (Signed)
Please read and follow all provided instructions.  Your diagnoses today include:  1. Nausea vomiting and diarrhea     Tests performed today include:  Blood counts and electrolytes  Blood tests to check liver and kidney function  Blood tests to check pancreas function  Blood test and EKG for heart - are normal  Vital signs. See below for your results today.   Medications prescribed:   Zofran (ondansetron) - for nausea and vomiting  Take any prescribed medications only as directed.  Home care instructions:   Follow any educational materials contained in this packet.   Your abdominal pain, nausea, vomiting, and diarrhea may be caused by a viral gastroenteritis also called 'stomach flu'. You should rest for the next several days. Keep drinking plenty of fluids and use the medicine for nausea as directed.    Drink clear liquids for the next 24 hours and introduce solid foods slowly after 24 hours using the b.r.a.t. diet (Bananas, Rice, Applesauce, Toast, Yogurt).    Follow-up instructions: Please follow-up with your primary care provider in the next 2 days for further evaluation of your symptoms. If you are not feeling better in 48 hours you may have a condition that is more serious and you need re-evaluation.   Return instructions:  SEEK IMMEDIATE MEDICAL ATTENTION IF:  If you have pain that does not go away or becomes severe   A temperature above 101F develops   Repeated vomiting occurs (multiple episodes)   If you have pain that becomes localized to portions of the abdomen. The right side could possibly be appendicitis. In an adult, the left lower portion of the abdomen could be colitis or diverticulitis.   Blood is being passed in stools or vomit (bright red or black tarry stools)   You develop chest pain, difficulty breathing, dizziness or fainting, or become confused, poorly responsive, or inconsolable (young children)  If you have any other emergent concerns  regarding your health  Additional Information: Abdominal (belly) pain can be caused by many things. Your caregiver performed an examination and possibly ordered blood/urine tests and imaging (CT scan, x-rays, ultrasound). Many cases can be observed and treated at home after initial evaluation in the emergency department. Even though you are being discharged home, abdominal pain can be unpredictable. Therefore, you need a repeated exam if your pain does not resolve, returns, or worsens. Most patients with abdominal pain don't have to be admitted to the hospital or have surgery, but serious problems like appendicitis and gallbladder attacks can start out as nonspecific pain. Many abdominal conditions cannot be diagnosed in one visit, so follow-up evaluations are very important.  Your vital signs today were: BP (!) 180/77    Pulse 99    Temp 97.8 F (36.6 C) (Oral)    Resp 18    Ht 5\' 1"  (1.549 m)    Wt 81.6 kg (180 lb)    SpO2 98%    BMI 34.01 kg/m  If your blood pressure (bp) was elevated above 135/85 this visit, please have this repeated by your doctor within one month. --------------

## 2018-09-16 ENCOUNTER — Other Ambulatory Visit: Payer: Self-pay

## 2018-09-16 ENCOUNTER — Encounter (HOSPITAL_BASED_OUTPATIENT_CLINIC_OR_DEPARTMENT_OTHER): Payer: Self-pay

## 2018-09-16 ENCOUNTER — Observation Stay (HOSPITAL_BASED_OUTPATIENT_CLINIC_OR_DEPARTMENT_OTHER)
Admission: EM | Admit: 2018-09-16 | Discharge: 2018-09-17 | Disposition: A | Discharge status: Home / Self Care | Payer: 59 | Attending: Internal Medicine | Admitting: Internal Medicine

## 2018-09-16 ENCOUNTER — Emergency Department (HOSPITAL_BASED_OUTPATIENT_CLINIC_OR_DEPARTMENT_OTHER): Payer: 59

## 2018-09-16 DIAGNOSIS — Z87891 Personal history of nicotine dependence: Secondary | ICD-10-CM | POA: Diagnosis not present

## 2018-09-16 DIAGNOSIS — Z7951 Long term (current) use of inhaled steroids: Secondary | ICD-10-CM | POA: Insufficient documentation

## 2018-09-16 DIAGNOSIS — J45909 Unspecified asthma, uncomplicated: Secondary | ICD-10-CM | POA: Diagnosis present

## 2018-09-16 DIAGNOSIS — R008 Other abnormalities of heart beat: Secondary | ICD-10-CM | POA: Insufficient documentation

## 2018-09-16 DIAGNOSIS — I499 Cardiac arrhythmia, unspecified: Secondary | ICD-10-CM | POA: Diagnosis present

## 2018-09-16 DIAGNOSIS — Z79899 Other long term (current) drug therapy: Secondary | ICD-10-CM | POA: Diagnosis not present

## 2018-09-16 DIAGNOSIS — I493 Ventricular premature depolarization: Secondary | ICD-10-CM | POA: Diagnosis not present

## 2018-09-16 DIAGNOSIS — E785 Hyperlipidemia, unspecified: Secondary | ICD-10-CM | POA: Diagnosis not present

## 2018-09-16 DIAGNOSIS — Z85828 Personal history of other malignant neoplasm of skin: Secondary | ICD-10-CM | POA: Insufficient documentation

## 2018-09-16 DIAGNOSIS — G40909 Epilepsy, unspecified, not intractable, without status epilepticus: Secondary | ICD-10-CM | POA: Insufficient documentation

## 2018-09-16 DIAGNOSIS — I10 Essential (primary) hypertension: Secondary | ICD-10-CM | POA: Diagnosis not present

## 2018-09-16 DIAGNOSIS — J4520 Mild intermittent asthma, uncomplicated: Secondary | ICD-10-CM | POA: Insufficient documentation

## 2018-09-16 DIAGNOSIS — I498 Other specified cardiac arrhythmias: Secondary | ICD-10-CM | POA: Diagnosis present

## 2018-09-16 DIAGNOSIS — K219 Gastro-esophageal reflux disease without esophagitis: Secondary | ICD-10-CM | POA: Diagnosis not present

## 2018-09-16 DIAGNOSIS — R531 Weakness: Secondary | ICD-10-CM | POA: Diagnosis not present

## 2018-09-16 DIAGNOSIS — F419 Anxiety disorder, unspecified: Secondary | ICD-10-CM | POA: Insufficient documentation

## 2018-09-16 DIAGNOSIS — J209 Acute bronchitis, unspecified: Secondary | ICD-10-CM | POA: Diagnosis not present

## 2018-09-16 DIAGNOSIS — E782 Mixed hyperlipidemia: Secondary | ICD-10-CM | POA: Diagnosis not present

## 2018-09-16 DIAGNOSIS — Z8249 Family history of ischemic heart disease and other diseases of the circulatory system: Secondary | ICD-10-CM | POA: Diagnosis not present

## 2018-09-16 HISTORY — DX: Unspecified malignant neoplasm of skin, unspecified: C44.90

## 2018-09-16 LAB — URINALYSIS, ROUTINE W REFLEX MICROSCOPIC
Bilirubin Urine: NEGATIVE
Glucose, UA: NEGATIVE mg/dL
Ketones, ur: NEGATIVE mg/dL
Leukocytes,Ua: NEGATIVE
Nitrite: NEGATIVE
Protein, ur: NEGATIVE mg/dL
Specific Gravity, Urine: 1.01 (ref 1.005–1.030)
pH: 7 (ref 5.0–8.0)

## 2018-09-16 LAB — COMPREHENSIVE METABOLIC PANEL
ALT: 30 U/L (ref 0–44)
AST: 25 U/L (ref 15–41)
Albumin: 4.4 g/dL (ref 3.5–5.0)
Alkaline Phosphatase: 63 U/L (ref 38–126)
Anion gap: 12 (ref 5–15)
BUN: 15 mg/dL (ref 8–23)
CO2: 24 mmol/L (ref 22–32)
Calcium: 9.6 mg/dL (ref 8.9–10.3)
Chloride: 103 mmol/L (ref 98–111)
Creatinine, Ser: 0.84 mg/dL (ref 0.44–1.00)
GFR calc Af Amer: >60 mL/min (ref >60)
GFR calc non Af Amer: >60 mL/min (ref >60)
Glucose, Bld: 97 mg/dL (ref 70–99)
Potassium: 4 mmol/L (ref 3.5–5.1)
Sodium: 139 mmol/L (ref 135–145)
Total Bilirubin: 0.7 mg/dL (ref 0.3–1.2)
Total Protein: 7.4 g/dL (ref 6.5–8.1)

## 2018-09-16 LAB — CBC WITH DIFFERENTIAL/PLATELET
Abs Immature Granulocytes: 0.34 10*3/uL — ABNORMAL HIGH (ref 0.00–0.07)
Basophils Absolute: 0.1 10*3/uL (ref 0.0–0.1)
Basophils Relative: 1 %
Eosinophils Absolute: 0.1 10*3/uL (ref 0.0–0.5)
Eosinophils Relative: 1 %
HCT: 44.1 % (ref 36.0–46.0)
Hemoglobin: 14.1 g/dL (ref 12.0–15.0)
Immature Granulocytes: 3 %
Lymphocytes Relative: 28 %
Lymphs Abs: 3.8 10*3/uL (ref 0.7–4.0)
MCH: 29.6 pg (ref 26.0–34.0)
MCHC: 32 g/dL (ref 30.0–36.0)
MCV: 92.5 fL (ref 80.0–100.0)
Monocytes Absolute: 1.7 10*3/uL — ABNORMAL HIGH (ref 0.1–1.0)
Monocytes Relative: 13 %
Neutro Abs: 7.4 10*3/uL (ref 1.7–7.7)
Neutrophils Relative %: 54 %
Platelets: 260 10*3/uL (ref 150–400)
RBC: 4.77 MIL/uL (ref 3.87–5.11)
RDW: 13.6 % (ref 11.5–15.5)
WBC: 13.4 10*3/uL — ABNORMAL HIGH (ref 4.0–10.5)
nRBC: 0 % (ref 0.0–0.2)

## 2018-09-16 LAB — URINALYSIS, MICROSCOPIC (REFLEX)

## 2018-09-16 LAB — CREATININE, SERUM
Creatinine, Ser: 0.75 mg/dL (ref 0.44–1.00)
GFR calc Af Amer: >60 mL/min (ref >60)
GFR calc non Af Amer: >60 mL/min (ref >60)

## 2018-09-16 LAB — CBC
HCT: 43.7 % (ref 36.0–46.0)
Hemoglobin: 14.5 g/dL (ref 12.0–15.0)
MCH: 29.9 pg (ref 26.0–34.0)
MCHC: 33.2 g/dL (ref 30.0–36.0)
MCV: 90.1 fL (ref 80.0–100.0)
Platelets: 255 10*3/uL (ref 150–400)
RBC: 4.85 MIL/uL (ref 3.87–5.11)
RDW: 13.5 % (ref 11.5–15.5)
WBC: 13.8 10*3/uL — ABNORMAL HIGH (ref 4.0–10.5)
nRBC: 0 % (ref 0.0–0.2)

## 2018-09-16 LAB — TROPONIN I: Troponin I: <0.03 ng/mL (ref <0.03)

## 2018-09-16 LAB — BRAIN NATRIURETIC PEPTIDE: B Natriuretic Peptide: 29.3 pg/mL (ref 0.0–100.0)

## 2018-09-16 LAB — CBG MONITORING, ED: Glucose-Capillary: 90 mg/dL (ref 70–99)

## 2018-09-16 LAB — D-DIMER, QUANTITATIVE: D-Dimer, Quant: 0.37 ug/mL-FEU (ref 0.00–0.50)

## 2018-09-16 LAB — MAGNESIUM: Magnesium: 2.2 mg/dL (ref 1.7–2.4)

## 2018-09-16 MED ORDER — SIMVASTATIN 20 MG PO TABS
40.0000 mg | ORAL_TABLET | Freq: Every evening | ORAL | Status: DC
  Administered 2018-09-16: 40 mg via ORAL
  Filled 2018-09-16: qty 2

## 2018-09-16 MED ORDER — ENOXAPARIN SODIUM 40 MG/0.4ML ~~LOC~~ SOLN
40.0000 mg | SUBCUTANEOUS | Status: DC
  Administered 2018-09-16: 20:00:00 40 mg via SUBCUTANEOUS
  Filled 2018-09-16: qty 0.4

## 2018-09-16 MED ORDER — SALINE SPRAY 0.65 % NA SOLN
1.0000 | NASAL | Status: DC | PRN
  Administered 2018-09-17: 1 via NASAL
  Filled 2018-09-16: qty 44

## 2018-09-16 MED ORDER — VITAMIN D (ERGOCALCIFEROL) 1.25 MG (50000 UNIT) PO CAPS: 50000.0000 [IU] | ORAL_CAPSULE | ORAL | Status: DC

## 2018-09-16 MED ORDER — CLONAZEPAM 0.5 MG PO TABS
0.5000 mg | ORAL_TABLET | Freq: Every day | ORAL | Status: DC
  Administered 2018-09-17: 0.5 mg via ORAL
  Filled 2018-09-16: qty 1

## 2018-09-16 MED ORDER — LEVETIRACETAM 500 MG PO TABS
500.0000 mg | ORAL_TABLET | Freq: Two times a day (BID) | ORAL | Status: DC
  Administered 2018-09-16 – 2018-09-17 (×2): 500 mg via ORAL
  Filled 2018-09-16 (×2): qty 1

## 2018-09-16 MED ORDER — LISINOPRIL 20 MG PO TABS
40.0000 mg | ORAL_TABLET | Freq: Every day | ORAL | Status: DC
  Administered 2018-09-17: 40 mg via ORAL
  Filled 2018-09-16: qty 2

## 2018-09-16 MED ORDER — ALBUTEROL SULFATE HFA 108 (90 BASE) MCG/ACT IN AERS: 2.0000 | INHALATION_SPRAY | Freq: Four times a day (QID) | RESPIRATORY_TRACT | Status: DC | PRN

## 2018-09-16 MED ORDER — AMLODIPINE BESYLATE 5 MG PO TABS
5.0000 mg | ORAL_TABLET | Freq: Every day | ORAL | Status: DC
  Administered 2018-09-17: 5 mg via ORAL
  Filled 2018-09-16: qty 1

## 2018-09-16 MED ORDER — FLUTICASONE PROPIONATE 50 MCG/ACT NA SUSP
2.0000 | Freq: Every day | NASAL | Status: DC | PRN
  Administered 2018-09-17: 2 via NASAL
  Filled 2018-09-16: qty 16

## 2018-09-16 MED ORDER — LABETALOL HCL 5 MG/ML IV SOLN
10.0000 mg | Freq: Once | INTRAVENOUS | Status: AC
  Administered 2018-09-16: 18:00:00 10 mg via INTRAVENOUS
  Filled 2018-09-16: qty 4

## 2018-09-16 MED ORDER — CALCIUM CARBONATE ANTACID 500 MG PO CHEW: CHEWABLE_TABLET | ORAL | Status: DC | PRN

## 2018-09-16 MED ORDER — ALBUTEROL SULFATE (2.5 MG/3ML) 0.083% IN NEBU: 2.5000 mg | INHALATION_SOLUTION | Freq: Four times a day (QID) | RESPIRATORY_TRACT | Status: DC | PRN

## 2018-09-16 MED ORDER — ONDANSETRON HCL 4 MG/2ML IJ SOLN: 4.0000 mg | Freq: Four times a day (QID) | INTRAMUSCULAR | Status: DC | PRN

## 2018-09-16 MED ORDER — ACETAMINOPHEN 500 MG PO TABS: 1000.0000 mg | ORAL_TABLET | Freq: Three times a day (TID) | ORAL | Status: DC | PRN

## 2018-09-16 MED ORDER — SODIUM CHLORIDE 0.9 % IV SOLN
INTRAVENOUS | Status: DC
  Administered 2018-09-16 – 2018-09-17 (×2): via INTRAVENOUS

## 2018-09-16 MED ORDER — PANTOPRAZOLE SODIUM 40 MG PO TBEC
40.0000 mg | DELAYED_RELEASE_TABLET | Freq: Every day | ORAL | Status: DC
  Administered 2018-09-17: 40 mg via ORAL
  Filled 2018-09-16: qty 1

## 2018-09-16 MED ORDER — ONDANSETRON 4 MG PO TBDP: 4.0000 mg | ORAL_TABLET | Freq: Three times a day (TID) | ORAL | Status: DC | PRN

## 2018-09-16 MED ORDER — ONDANSETRON HCL 4 MG PO TABS: 4.0000 mg | ORAL_TABLET | Freq: Four times a day (QID) | ORAL | Status: DC | PRN

## 2018-09-16 MED ORDER — POTASSIUM 99 MG PO TABS: 99.0000 mg | ORAL_TABLET | Freq: Every day | ORAL | Status: DC | PRN

## 2018-09-16 MED ORDER — NAPHAZOLINE-GLYCERIN 0.012-0.2 % OP SOLN
1.0000 [drp] | Freq: Four times a day (QID) | OPHTHALMIC | Status: DC | PRN
  Administered 2018-09-17: 1 [drp] via OPHTHALMIC
  Filled 2018-09-16: qty 15

## 2018-09-16 MED ORDER — ADULT MULTIVITAMIN W/MINERALS CH
1.0000 | ORAL_TABLET | Freq: Every day | ORAL | Status: DC
  Administered 2018-09-17: 06:00:00 1 via ORAL
  Filled 2018-09-16: qty 1

## 2018-09-16 MED ORDER — MOMETASONE FURO-FORMOTEROL FUM 200-5 MCG/ACT IN AERO
2.0000 | INHALATION_SPRAY | Freq: Two times a day (BID) | RESPIRATORY_TRACT | Status: DC
  Administered 2018-09-16 – 2018-09-17 (×2): 2 via RESPIRATORY_TRACT
  Filled 2018-09-16: qty 8.8

## 2018-09-16 MED ORDER — LORATADINE 10 MG PO TABS
10.0000 mg | ORAL_TABLET | Freq: Every day | ORAL | Status: DC
  Administered 2018-09-16 – 2018-09-17 (×2): 10 mg via ORAL
  Filled 2018-09-16 (×2): qty 1

## 2018-09-16 MED ORDER — ZOLPIDEM TARTRATE 5 MG PO TABS
5.0000 mg | ORAL_TABLET | Freq: Every day | ORAL | Status: DC
  Administered 2018-09-16: 5 mg via ORAL
  Filled 2018-09-16: qty 1

## 2018-09-16 NOTE — ED Notes (Signed)
Patient transported to X-ray 

## 2018-09-16 NOTE — H&P (Signed)
History and Physical   Monica Booth UYQ:[id number] DOB: May 02, 1956 DOA: 09/16/2018  Referring MD/NP/PA: Janeece Fitting, PA  PCP: [facility]   Outpatient Specialists: None   [facility]  Chief Complaint: Weakness  HPI: Monica Booth is a 63 y.o. female with medical history significant of asthma, hypertension, hyperlipidemia, GERD, anxiety disorder who presented to [facility] with complaint of generalized weakness for about 1 to 2 weeks.  Patient has been having upper respite tract infection that was thought to be her asthma exacerbation over the last 2 weeks.  She was seen repeatedly by her PCP and received multiple rounds of prednisone as well as Z-Pak and shots of antibiotics.  She has also been using her nebulizer treatment.  She continues to have significant weakness and not feeling all that well.  She has gradually weakened to where she is unable to do her normal daily routine.  She has some chest pain today with some dizziness.  She had baseline history of seizure disorder but has not had any seizure in a while.  Patient became worried and went to the ER where she was evaluated.  As part of her evaluation she was noted to have abnormal EKG specifically evidence of bigeminy.  Patient therefore was sent over for admission and evaluation of any cardiac causes of her symptoms.  Cardiology has been consulted by the ER who will follow along.  She is admitted to the medical service.  No ongoing chest pain.  Initial enzymes so far negative..  ED Course: Temperature is 98.8 blood pressure initially 219/92 pulse 40-95 respirate of 26 oxygen sat 96% room air.  CBC and CMP all appear to be within normal except for white count of 13.4.  Troponin is negative BNP 29.3.  D-dimer is 0.37.  Glucose 97.  Chest x-ray showed no active findings.  EKG showed evidence of bigeminy.  Review of Systems: As per HPI otherwise 10 point review of systems negative.     Past Medical History:  Diagnosis Date  . Anxiety   . Asthma   . GERD (gastroesophageal reflux disease)   . GI problem    freq n/v  . Hyperlipemia   . Hypertension   . Seizures ([place])   . Skin cancer     Past Surgical History:  Procedure Laterality Date  . ABDOMINAL HYSTERECTOMY    . BRAIN SURGERY    . CHOLECYSTECTOMY    . COLONOSCOPY    . CRANIOTOMY  2014   [facility]  . HERNIA REPAIR     umb  . SKIN SPLIT GRAFT Right 03/31/2014   Procedure: ADJACENT TISSUE TRANSFER RIGHT CHEEK;  Surgeon: Irene Limbo, MD;  Location: [facility];  Service: Plastics;  Laterality: Right;  . UPPER GI ENDOSCOPY       reports that she quit smoking about 29 years ago. She has never used smokeless tobacco. She reports that she does not drink alcohol or use drugs.  No Known Allergies  No family history on file.   Prior to Admission medications   Medication Sig Start Date End Date Taking? Authorizing Provider  albuterol (PROVENTIL HFA;VENTOLIN HFA) 108 (90 BASE) MCG/ACT inhaler Inhale 2 puffs into the lungs every 6 (six) hours as needed for wheezing or shortness of breath.    [provider]  amLODipine (NORVASC) 5 MG tablet Take 5 mg by mouth daily.    [provider]  cetirizine (ZYRTEC) 10 MG tablet  Take 10 mg by mouth daily.    [provider]  clonazePAM (KLONOPIN) 0.5 MG tablet Take 0.5 mg by mouth 2 (two) times daily.    [provider]  esomeprazole (NEXIUM) 40 MG capsule Take 40 mg by mouth daily.    [provider]  fluticasone (FLONASE) 50 MCG/ACT nasal spray Place 2 sprays into both nostrils daily.    [provider]  levETIRAcetam (KEPPRA) 500 MG tablet Take 500 mg by mouth 2 (two) times daily.    [provider]  lisinopril (PRINIVIL,ZESTRIL) 40 MG tablet Take 40 mg by mouth daily.    [provider]  ondansetron (ZOFRAN ODT) 4 MG disintegrating tablet Take 1 tablet (4 mg total)  by mouth every 8 (eight) hours as needed for nausea or vomiting. 10/13/17   Carlisle Cater, PA-C  OVER THE COUNTER MEDICATION Place 2 drops into both eyes 3 (three) times daily. "OTC [employer] Brand Redness Relief"    [provider]  Potassium 99 MG TABS Take 99 mg by mouth daily as needed (for feeling weak).    [provider]  simvastatin (ZOCOR) 40 MG tablet Take 40 mg by mouth daily.    [provider]  Vitamin D, Ergocalciferol, (DRISDOL) 50000 UNITS CAPS capsule Take 50,000 Units by mouth every 7 (seven) days.    [provider]  zolpidem (AMBIEN) 10 MG tablet Take 10 mg by mouth at bedtime.    [provider]    Physical Exam: Vitals:   09/16/18 1450 09/16/18 1500 09/16/18 1654 09/16/18 1725  BP: (!) 200/79 (!) 173/79 (!) 180/85 (!) 219/92  Pulse: 83 (!) 40 95 94  Resp: 16 (!) 26 20   Temp:      TempSrc:      SpO2: 100% 97% 99%   Weight:      Height:          Constitutional: NAD, calm, comfortable, morbidly obese Vitals:   09/16/18 1450 09/16/18 1500 09/16/18 1654 09/16/18 1725  BP: (!) 200/79 (!) 173/79 (!) 180/85 (!) 219/92  Pulse: 83 (!) 40 95 94  Resp: 16 (!) 26 20   Temp:      TempSrc:      SpO2: 100% 97% 99%   Weight:      Height:       Eyes: PERRL, lids and conjunctivae normal ENMT: Mucous membranes are moist. Posterior pharynx clear of any exudate or lesions.Normal dentition.  Neck: normal, supple, no masses, no thyromegaly Respiratory: clear to auscultation bilaterally, no wheezing, no crackles. Normal respiratory effort. No accessory muscle use.  Cardiovascular: Regular rate and rhythm, no murmurs / rubs / gallops. No extremity edema. 2+ pedal pulses. No carotid bruits.  Abdomen: no tenderness, no masses palpated. No hepatosplenomegaly. Bowel sounds positive.  Musculoskeletal: no clubbing / cyanosis. No joint deformity upper and lower extremities. Good ROM, no contractures. Normal muscle tone.  Skin: no  rashes, lesions, ulcers. No induration Neurologic: CN 2-12 grossly intact. Sensation intact, DTR normal. Strength 5/5 in all 4.  Psychiatric: Normal judgment and insight. Alert and oriented x 3.  Depressed mood    Labs on Admission: I have personally reviewed following labs and imaging studies  CBC: Recent Labs  Lab 09/16/18 1421  WBC 13.4*  NEUTROABS 7.4  HGB 14.1  HCT 44.1  MCV 92.5  PLT 448   Basic Metabolic Panel: Recent Labs  Lab 09/16/18 1421  NA 139  K 4.0  CL 103  CO2 24  GLUCOSE 97  BUN 15  CREATININE 0.84  CALCIUM 9.6  MG 2.2   GFR: Estimated Creatinine Clearance: 72.2 mL/min (by C-G formula based on SCr of 0.84 mg/dL). Liver Function Tests: Recent Labs  Lab 09/16/18 1421  AST 25  ALT 30  ALKPHOS 63  BILITOT 0.7  PROT 7.4  ALBUMIN 4.4   No results for input(s): LIPASE, AMYLASE in the last 168 hours. No results for input(s): AMMONIA in the last 168 hours. Coagulation Profile: No results for input(s): INR, PROTIME in the last 168 hours. Cardiac Enzymes: Recent Labs  Lab 09/16/18 1421  TROPONINI <0.03   BNP (last 3 results) No results for input(s): PROBNP in the last 8760 hours. HbA1C: No results for input(s): HGBA1C in the last 72 hours. CBG: Recent Labs  Lab 09/16/18 1427  GLUCAP 90   Lipid Profile: No results for input(s): CHOL, HDL, LDLCALC, TRIG, CHOLHDL, LDLDIRECT in the last 72 hours. Thyroid Function Tests: No results for input(s): TSH, T4TOTAL, FREET4, T3FREE, THYROIDAB in the last 72 hours. Anemia Panel: No results for input(s): VITAMINB12, FOLATE, FERRITIN, TIBC, IRON, RETICCTPCT in the last 72 hours. Urine analysis:    Component Value Date/Time   COLORURINE YELLOW 09/16/2018 1400   APPEARANCEUR CLEAR 09/16/2018 1400   LABSPEC 1.010 09/16/2018 1400   PHURINE 7.0 09/16/2018 1400   GLUCOSEU NEGATIVE 09/16/2018 1400   HGBUR TRACE (A) 09/16/2018 1400   BILIRUBINUR NEGATIVE 09/16/2018 1400   KETONESUR NEGATIVE 09/16/2018  1400   PROTEINUR NEGATIVE 09/16/2018 1400   UROBILINOGEN 0.2 02/09/2014 2234   NITRITE NEGATIVE 09/16/2018 1400   LEUKOCYTESUR NEGATIVE 09/16/2018 1400   Sepsis Labs: @LABRCNTIP (procalcitonin:4,lacticidven:4) )No results found for this or any previous visit (from the past 240 hour(s)).   Radiological Exams on Admission: Dg Chest 2 View  Result Date: 09/16/2018 CLINICAL DATA:  Chest pain EXAM: CHEST - 2 VIEW COMPARISON:  01/03/2016 FINDINGS: The cardiac silhouette is enlarged. There is no significant pleural effusion or area of consolidation. There is likely mild atelectasis or scarring at the lung bases bilaterally. IMPRESSION: No active cardiopulmonary disease. Cardiomegaly. Electronically Signed   By: Constance Holster M.D.   On: 09/16/2018 14:07    EKG: Independently reviewed. It shows normal sinus rhythm with a rate of 92, marked ventricular bigeminy.  Assessment/Plan Principal Problem:   Ventricular bigeminy Active Problems:   Benign essential HTN   Asthma   Seizure disorder (HCC)   GERD (gastroesophageal reflux disease)   Hyperlipidemia, mixed     #1 abnormal EKG: Ventricular bigeminy noted.  Patient has been weak in the last 2 weeks.  No prior history of cardiac disease.  Patient will be admitted for observation.  Cycle her enzymes and get echocardiogram.  Cardiology to follow and will follow recommendations.  #2 hypertension: Longstanding hypertension.  Resume antihypertensives from home and monitor.  #3 history of asthma: No evidence of acute exacerbation.  Continue empiric treatment.  #4 GERD: Continue with PPIs.  #5 seizure disorder: Continue home regimen.  No evidence of seizure at this point.  #6 hyperlipidemia: Again continue with home regimen.   DVT prophylaxis: Lovenox Code Status: Full code Family Communication: Discussed care with patient fully Disposition Plan: Home Consults called: Cardiology Admission status: Observation  Severity of  Illness: The appropriate patient status for this patient is OBSERVATION. Observation status is judged to be reasonable and necessary in order to provide the required intensity of service to ensure the patient's safety. The patient's presenting symptoms, physical exam findings, and initial radiographic and  laboratory data in the context of their medical condition is felt to place them at decreased risk for further clinical deterioration. Furthermore, it is anticipated that the patient will be medically stable for discharge from the hospital within 2 midnights of admission. The following factors support the patient status of observation.   " The patient's presenting symptoms include weakness and abnormal EKG. " The physical exam findings include no significant finding on exam. " The initial radiographic and laboratory data are abnormal EKG.     Barbette Merino MD [facility] Pager [phone]  If 7PM-7AM, please contact night-coverage [email or web address] Password [facility]  09/16/2018, 7:29 PM

## 2018-09-16 NOTE — ED Notes (Signed)
Pt ambulated to RR with minimal assistance 

## 2018-09-16 NOTE — ED Triage Notes (Signed)
C/o weakness x 1 week-seen by PCP this week for asthma and sinus issues-tx with prednisone and abx shot-pt to triage in w/c-NAD

## 2018-09-16 NOTE — ED Notes (Signed)
Report to Arbie Cookey, RN at [facility]

## 2018-09-16 NOTE — ED Provider Notes (Signed)
[facility] DEPART[facility] Provider Note   CSN: [id number] Arrival date & time: 09/16/18  1310    History   Chief Complaint Chief Complaint  Patient presents with  . Weakness    HPI Monica Booth is a 63 y.o. female.     63 y.o female with a PMH of Asthma, HLD, HTN presents to the ED with a chief complaint of weakness x 1-2 weeks. Patient reports she has recently been seen by her PCP for URI symptoms along with being treated with steroids along with a zpack. Reports she recently switched PCP and states "I feel like every time I see him, I just get steroids and antibiotics". She reports last receiving a another steroid shot along with an injection of antibiotic sometime this week.  Patient reports she also has been using her nebulizer, used it last prior to arrival.  She reports feeling weakness all along her body, reports feeling like she is "after a seizure ", she also endorses dizziness, reports feeling like the room is spinning and she is likely floating.  Some chest pain prior to arrival which he describes as sharp across the center to left side of her chest.  No exacerbating or alleviating factors.  She endorses a cough which was productive with yellow sputum, which is now improving after steroid therapy. She denies any shortness of breath, abdominal pain or headache.      Past Medical History:  Diagnosis Date  . Anxiety   . Asthma   . GERD (gastroesophageal reflux disease)   . GI problem    freq n/v  . Hyperlipemia   . Hypertension   . Seizures ([place])   . Skin cancer     There are no active problems to display for this patient.   Past Surgical History:  Procedure Laterality Date  . ABDOMINAL HYSTERECTOMY    . BRAIN SURGERY    . CHOLECYSTECTOMY    . COLONOSCOPY    . CRANIOTOMY  2014   [facility]-frontal lobe  . HERNIA REPAIR     umb  . SKIN SPLIT GRAFT Right 03/31/2014   Procedure: ADJACENT TISSUE TRANSFER RIGHT CHEEK;  Surgeon: Irene Limbo, MD;   Location: [place];  Service: Plastics;  Laterality: Right;  . UPPER GI ENDOSCOPY       OB History   No obstetric history on file.      Home Medications    Prior to Admission medications   Medication Sig Start Date End Date Taking? Authorizing Provider  albuterol (PROVENTIL HFA;VENTOLIN HFA) 108 (90 BASE) MCG/ACT inhaler Inhale 2 puffs into the lungs every 6 (six) hours as needed for wheezing or shortness of breath.    [provider]  amLODipine (NORVASC) 5 MG tablet Take 5 mg by mouth daily.    [provider]  cetirizine (ZYRTEC) 10 MG tablet Take 10 mg by mouth daily.    [provider]  clonazePAM (KLONOPIN) 0.5 MG tablet Take 0.5 mg by mouth 2 (two) times daily.    [provider]  esomeprazole (NEXIUM) 40 MG capsule Take 40 mg by mouth daily.    [provider]  fluticasone (FLONASE) 50 MCG/ACT nasal spray Place 2 sprays into both nostrils daily.    [provider]  levETIRAcetam (KEPPRA) 500 MG tablet Take 500 mg by mouth 2 (two) times daily.    [provider]  lisinopril (PRINIVIL,ZESTRIL) 40 MG tablet Take 40 mg by mouth daily.    [provider]  ondansetron (ZOFRAN ODT) 4 MG disintegrating tablet Take 1 tablet (4 mg total) by mouth every 8 (eight) hours as needed for nausea or vomiting. 10/13/17   Carlisle Cater, PA-C  OVER THE COUNTER MEDICATION Place 2 drops into both eyes 3 (three) times daily. "OTC [employer] Brand Redness Relief"    [provider]  Potassium 99 MG TABS Take 99 mg by mouth daily as needed (for feeling weak).    [provider]  simvastatin (ZOCOR) 40 MG tablet Take 40 mg by mouth daily.    [provider]  Vitamin D, Ergocalciferol, (DRISDOL) 50000 UNITS CAPS capsule Take 50,000 Units by mouth every 7 (seven) days.    [provider]  zolpidem (AMBIEN) 10 MG tablet Take 10 mg by mouth at bedtime.    [provider]     Family History No family history on file.  Social History Social History   Tobacco Use  . Smoking status: Former Smoker    Last attempt to quit: 03/30/1989    Years since quitting: 29.4  . Smokeless tobacco: Never Used  Substance Use Topics  . Alcohol use: No  . Drug use: No     Allergies   Patient has no known allergies.   Review of Systems Review of Systems  Constitutional: Negative for chills and fever.  HENT: Negative for ear pain and sore throat.   Eyes: Negative for pain and visual disturbance.  Respiratory: Positive for cough. Negative for shortness of breath.   Cardiovascular: Positive for chest pain. Negative for palpitations.  Gastrointestinal: Negative for abdominal pain and vomiting.  Genitourinary: Negative for dysuria and hematuria.  Musculoskeletal: Negative for arthralgias and back pain.  Skin: Negative for color change and rash.  Neurological: Positive for dizziness and weakness. Negative for seizures, syncope, speech difficulty and light-headedness.  All other systems reviewed and are negative.    Physical Exam Updated Vital Signs BP (!) 200/79   Pulse 83   Temp 97.9 F (36.6 C) (Oral)   Resp 16   Ht 5\' 1"  (1.549 m)   Wt 90.7 kg   SpO2 100%   BMI 37.79 kg/m   Physical Exam Vitals signs and nursing note reviewed.  Constitutional:      General: She is not in acute distress.    Appearance: She is well-developed. She is ill-appearing.  HENT:     Head: Normocephalic and atraumatic.     Mouth/Throat:     Pharynx: No oropharyngeal exudate.  Eyes:     Pupils: Pupils are equal, round, and reactive to light.  Neck:     Musculoskeletal: Normal range of motion.     Vascular: No JVD.  Cardiovascular:     Rate and Rhythm: Normal rate.     Heart sounds: Murmur present.  Pulmonary:     Effort: Pulmonary effort is normal. No respiratory distress.     Breath sounds: Normal breath sounds.  Abdominal:     General: Bowel sounds are normal.  There is no distension.     Palpations: Abdomen is soft.     Tenderness: There is no abdominal tenderness.  Musculoskeletal:        General: No tenderness or deformity.     Right lower leg: No edema.     Left lower leg: No edema.  Skin:    General: Skin is warm and dry.  Neurological:     Mental Status: She is alert and oriented to person, place, and time.  ED Treatments / Results  Labs (all labs ordered are listed, but only abnormal results are displayed) Labs Reviewed  CBC WITH DIFFERENTIAL/PLATELET - Abnormal; Notable for the following components:      Result Value   WBC 13.4 (*)    Monocytes Absolute 1.7 (*)    Abs Immature Granulocytes 0.34 (*)    All other components within normal limits  URINALYSIS, ROUTINE W REFLEX MICROSCOPIC - Abnormal; Notable for the following components:   Hgb urine dipstick TRACE (*)    All other components within normal limits  URINALYSIS, MICROSCOPIC (REFLEX) - Abnormal; Notable for the following components:   Bacteria, UA RARE (*)    All other components within normal limits  COMPREHENSIVE METABOLIC PANEL  TROPONIN I  MAGNESIUM  BRAIN NATRIURETIC PEPTIDE  D-DIMER, QUANTITATIVE (NOT AT [facility])  CBG MONITORING, ED    EKG EKG Interpretation  Date/Time:  Thursday Sep 16 2018 14:26:03 EDT Ventricular Rate:  92 PR Interval:    QRS Duration: 94 QT Interval:  393 QTC Calculation: 424 R Axis:   50 Text Interpretation:  Sinus rhythm Ventricular bigeminy Low voltage, precordial leads Baseline wander in lead(s) V2 ventricular bigeminy is new from previous Confirmed by Theotis Burrow [phone]) on 09/16/2018 2:28:20 PM   Radiology Dg Chest 2 View  Result Date: 09/16/2018 CLINICAL DATA:  Chest pain EXAM: CHEST - 2 VIEW COMPARISON:  01/03/2016 FINDINGS: The cardiac silhouette is enlarged. There is no significant pleural effusion or area of consolidation. There is likely mild atelectasis or scarring at the lung bases bilaterally. IMPRESSION: No  active cardiopulmonary disease. Cardiomegaly. Electronically Signed   By: Constance Holster M.D.   On: 09/16/2018 14:07    Procedures Procedures (including critical care time)  Medications Ordered in ED Medications - No data to display   Initial Impression / Assessment and Plan / ED Course  I have reviewed the triage vital signs and the nursing notes.  Pertinent labs & imaging results that were available during my care of the patient were reviewed by me and considered in my medical decision making (see chart for details).    With a past medical history of asthma presents to the ED with weakness x1 to 2 weeks.  Has been seen by PCP and treated with multiple rounds of steroids, Z-Pak, antibiotics.  Reports seeing her PCP this week who gave her an injection of steroids along with an injection of antibiotic.  Reports feeling weak all over, states that there is dizziness associated with this along with some chest pain and shortness of breath.  She reports recently switching doctors. CBC showed slight increase in white blood cell count, hemoglobin is within normal limits.  CMP showed no electrolyte normality, creatinine level is within normal limits.  LFTs remain unchanged.  Magnesium level is normal.  Troponin was obtained which was negative, BG was within normal limits, and EKG was obtained due to patient's weakness along with chest pain, EKG showed bigeminy with a rate of ~85, will call cardiology for consultation of asymptomatic bigeminy. BNP was normal. Chest xray showed  No active cardiopulmonary disease.    Cardiomegaly.     Urinalysis showed rate bacteria, no wbc. No nitrites or leukocytes. D-dimer was negative.    3:43 PM patient was reassessed, ports she continued to feel fatigue, she was informed of her results.  At this time will place call for cardiology and the recommendations. She reports no cardiolo  3:59 PM Spoke to Dr. Oval Linsey, who reported patient is in ventricular  bigeminy, if admitted patient would benefit from ECHO and further weakness workup. Will be seen in consultation by cardiology. Call placed for hospitalist.  4:26 PM Spoke to hospitalist who will admit patient for further workup and management of dizziness.   Final Clinical Impressions(s) / ED Diagnoses   Final diagnoses:  Weakness  Ventricular bigeminy    ED Discharge Orders    None       Janeece Fitting, PA-C 09/16/18 1627    Little, Wenda Overland, MD 09/20/18 1727

## 2018-09-16 NOTE — ED Notes (Signed)
Ventricular bigeminy; HR w/ PVCS 79

## 2018-09-16 NOTE — ED Notes (Signed)
Attempted IV access x3 w/o success.  

## 2018-09-17 ENCOUNTER — Observation Stay (HOSPITAL_BASED_OUTPATIENT_CLINIC_OR_DEPARTMENT_OTHER): Payer: 59

## 2018-09-17 ENCOUNTER — Encounter (HOSPITAL_COMMUNITY): Payer: Self-pay | Admitting: Cardiology

## 2018-09-17 DIAGNOSIS — J4520 Mild intermittent asthma, uncomplicated: Secondary | ICD-10-CM | POA: Diagnosis not present

## 2018-09-17 DIAGNOSIS — R008 Other abnormalities of heart beat: Secondary | ICD-10-CM | POA: Diagnosis not present

## 2018-09-17 DIAGNOSIS — I499 Cardiac arrhythmia, unspecified: Secondary | ICD-10-CM

## 2018-09-17 DIAGNOSIS — E782 Mixed hyperlipidemia: Secondary | ICD-10-CM | POA: Diagnosis not present

## 2018-09-17 DIAGNOSIS — I10 Essential (primary) hypertension: Secondary | ICD-10-CM | POA: Diagnosis present

## 2018-09-17 DIAGNOSIS — R0789 Other chest pain: Secondary | ICD-10-CM | POA: Diagnosis not present

## 2018-09-17 DIAGNOSIS — R9431 Abnormal electrocardiogram [ECG] [EKG]: Secondary | ICD-10-CM

## 2018-09-17 DIAGNOSIS — J209 Acute bronchitis, unspecified: Secondary | ICD-10-CM | POA: Diagnosis present

## 2018-09-17 DIAGNOSIS — R531 Weakness: Secondary | ICD-10-CM | POA: Diagnosis not present

## 2018-09-17 LAB — CBC
HCT: 38.9 % (ref 36.0–46.0)
Hemoglobin: 12.6 g/dL (ref 12.0–15.0)
MCH: 29.3 pg (ref 26.0–34.0)
MCHC: 32.4 g/dL (ref 30.0–36.0)
MCV: 90.5 fL (ref 80.0–100.0)
Platelets: 233 10*3/uL (ref 150–400)
RBC: 4.3 MIL/uL (ref 3.87–5.11)
RDW: 13.6 % (ref 11.5–15.5)
WBC: 10.9 10*3/uL — ABNORMAL HIGH (ref 4.0–10.5)
nRBC: 0 % (ref 0.0–0.2)

## 2018-09-17 LAB — COMPREHENSIVE METABOLIC PANEL
ALT: 27 U/L (ref 0–44)
AST: 19 U/L (ref 15–41)
Albumin: 3.6 g/dL (ref 3.5–5.0)
Alkaline Phosphatase: 51 U/L (ref 38–126)
Anion gap: 9 (ref 5–15)
BUN: 10 mg/dL (ref 8–23)
CO2: 24 mmol/L (ref 22–32)
Calcium: 9.1 mg/dL (ref 8.9–10.3)
Chloride: 105 mmol/L (ref 98–111)
Creatinine, Ser: 0.75 mg/dL (ref 0.44–1.00)
GFR calc Af Amer: >60 mL/min (ref >60)
GFR calc non Af Amer: >60 mL/min (ref >60)
Glucose, Bld: 111 mg/dL — ABNORMAL HIGH (ref 70–99)
Potassium: 4 mmol/L (ref 3.5–5.1)
Sodium: 138 mmol/L (ref 135–145)
Total Bilirubin: 0.6 mg/dL (ref 0.3–1.2)
Total Protein: 6 g/dL — ABNORMAL LOW (ref 6.5–8.1)

## 2018-09-17 LAB — ECHOCARDIOGRAM COMPLETE
Height: 61 in
Weight: 3146.41 oz

## 2018-09-17 LAB — TSH: TSH: 3.966 u[IU]/mL (ref 0.350–4.500)

## 2018-09-17 LAB — HIV ANTIBODY (ROUTINE TESTING W REFLEX): HIV Screen 4th Generation wRfx: NONREACTIVE

## 2018-09-17 MED ORDER — BENZONATATE 100 MG PO CAPS: 100.0000 mg | ORAL_CAPSULE | Freq: Three times a day (TID) | ORAL | 0 refills | Status: DC | PRN

## 2018-09-17 MED ORDER — METOPROLOL TARTRATE 25 MG PO TABS: 12.5000 mg | ORAL_TABLET | Freq: Two times a day (BID) | ORAL | 3 refills | Status: AC

## 2018-09-17 MED ORDER — GUAIFENESIN-DM 100-10 MG/5ML PO SYRP: 5.0000 mL | ORAL_SOLUTION | ORAL | 0 refills | Status: AC | PRN

## 2018-09-17 MED ORDER — METOPROLOL TARTRATE 12.5 MG HALF TABLET
12.5000 mg | ORAL_TABLET | Freq: Two times a day (BID) | ORAL | Status: DC
  Administered 2018-09-17: 12.5 mg via ORAL
  Filled 2018-09-17: qty 1

## 2018-09-17 MED ORDER — GUAIFENESIN ER 600 MG PO TB12
1200.0000 mg | ORAL_TABLET | Freq: Two times a day (BID) | ORAL | Status: DC
  Administered 2018-09-17: 1200 mg via ORAL
  Filled 2018-09-17: qty 2

## 2018-09-17 MED ORDER — FLUTICASONE PROPIONATE 50 MCG/ACT NA SUSP
2.0000 | Freq: Every day | NASAL | Status: DC
  Filled 2018-09-17: qty 16

## 2018-09-17 MED ORDER — BENZONATATE 100 MG PO CAPS
100.0000 mg | ORAL_CAPSULE | Freq: Three times a day (TID) | ORAL | Status: DC
  Administered 2018-09-17: 100 mg via ORAL
  Filled 2018-09-17: qty 1

## 2018-09-17 MED ORDER — FLUTICASONE PROPIONATE 50 MCG/ACT NA SUSP: 2.0000 | Freq: Every day | NASAL | 2 refills | Status: AC

## 2018-09-17 MED ORDER — GUAIFENESIN-DM 100-10 MG/5ML PO SYRP: 5.0000 mL | ORAL_SOLUTION | ORAL | Status: DC | PRN

## 2018-09-17 MED ORDER — ATORVASTATIN CALCIUM 10 MG PO TABS: 20.0000 mg | ORAL_TABLET | Freq: Every day | ORAL | Status: DC

## 2018-09-17 MED ORDER — GUAIFENESIN ER 600 MG PO TB12: 1200.0000 mg | ORAL_TABLET | Freq: Two times a day (BID) | ORAL | 0 refills | Status: AC

## 2018-09-17 MED ORDER — HYDRALAZINE HCL 20 MG/ML IJ SOLN: 10.0000 mg | Freq: Four times a day (QID) | INTRAMUSCULAR | Status: DC | PRN

## 2018-09-17 MED ORDER — CETIRIZINE HCL 10 MG PO TABS: 10.0000 mg | ORAL_TABLET | Freq: Every day | ORAL | 3 refills | Status: AC

## 2018-09-17 NOTE — Progress Notes (Signed)
  Echocardiogram 2D Echocardiogram has been performed.  Monica Booth 09/17/2018, 10:45 AM

## 2018-09-17 NOTE — Progress Notes (Signed)
Pt left unit for home . Taken to car in wheelchair.

## 2018-09-17 NOTE — Discharge Summary (Signed)
Physician Discharge Summary   Patient ID: Monica Booth MRN: [id number] DOB/AGE: 01/22/1956 63 y.o.  Admit date: 09/16/2018 Discharge date: 09/17/2018  Primary Care Physician:  [facility]   Recommendations for Outpatient Follow-up:  1. Follow up with PCP in 1-2 weeks 2. Cardiology will arrange outpatient CTA, may need outpatient event monitor if symptoms persist  Home Health: None, patient ambulating without any difficulty Equipment/Devices:   Discharge Condition: stable  CODE STATUS: FULL  Diet recommendation: Heart healthy diet   Discharge Diagnoses:   . Ventricular bigeminy . Acute bronchitis . Accelerated hypertension . Asthma . GERD (gastroesophageal reflux disease) . Hyperlipidemia, mixed    Consults: Cardiology    Allergies:   Allergies  Allergen Reactions  . Cheese Nausea Only and Other (See Comments)    "GI upset," PER ALLERGY TEST  . Yeast Nausea Only and Other (See Comments)    "GI upset," PER ALLERGY TEST     DISCHARGE MEDICATIONS: Allergies as of 09/17/2018      Reactions   Cheese Nausea Only, Other (See Comments)   "GI upset," PER ALLERGY TEST   Yeast Nausea Only, Other (See Comments)   "GI upset," PER ALLERGY TEST      Medication List    TAKE these medications   acetaminophen 500 MG tablet Commonly known as:  TYLENOL Take 1,000 mg by mouth every 8 (eight) hours as needed for mild pain or headache.   Advair Diskus 250-50 MCG/DOSE Aepb Generic drug:  Fluticasone-Salmeterol Inhale 1 puff into the lungs 2 (two) times daily.   amLODipine 5 MG tablet Commonly known as:  NORVASC Take 5 mg by mouth daily.   benzonatate 100 MG capsule Commonly known as:  TESSALON Take 1 capsule (100 mg total) by mouth 3 (three) times daily as needed for cough.   calcium carbonate 750 MG chewable tablet Commonly known as:  TUMS EX Chew 1-2 tablets by mouth as needed for heartburn.   cetirizine 10 MG tablet Commonly known as:  ZYRTEC Take 1  tablet (10 mg total) by mouth daily. Take daily for 1 month, then as needed for allergies What changed:    when to take this  reasons to take this  additional instructions   clonazePAM 0.5 MG tablet Commonly known as:  KLONOPIN Take 0.5 mg by mouth daily at 12 noon.   esomeprazole 40 MG capsule Commonly known as:  NEXIUM Take 40 mg by mouth daily.   fluticasone 50 MCG/ACT nasal spray Commonly known as:  FLONASE Place 2 sprays into both nostrils daily. Take daily for 1 month, then as needed What changed:    when to take this  reasons to take this  additional instructions   guaiFENesin 600 MG 12 hr tablet Commonly known as:  MUCINEX Take 2 tablets (1,200 mg total) by mouth 2 (two) times daily.   guaiFENesin-dextromethorphan 100-10 MG/5ML syrup Commonly known as:  ROBITUSSIN DM Take 5 mLs by mouth every 4 (four) hours as needed for cough.   Keppra 500 MG tablet Generic drug:  levETIRAcetam Take 500 mg by mouth 2 (two) times daily.   lisinopril 40 MG tablet Commonly known as:  ZESTRIL Take 40 mg by mouth daily.   metoprolol tartrate 25 MG tablet Commonly known as:  LOPRESSOR Take 0.5 tablets (12.5 mg total) by mouth 2 (two) times daily.   ondansetron 4 MG disintegrating tablet Commonly known as:  Zofran ODT Take 1 tablet (4 mg total) by mouth every 8 (eight) hours as needed for nausea  or vomiting.   One-A-Day Womens 50+ Advantage Tabs Take 1 tablet by mouth daily with breakfast.   Potassium 99 MG Tabs Take 99 mg by mouth daily as needed (for feeling weak).   ProAir HFA 108 (90 Base) MCG/ACT inhaler Generic drug:  albuterol Inhale 2 puffs into the lungs every 6 (six) hours as needed for wheezing or shortness of breath.   albuterol (2.5 MG/3ML) 0.083% nebulizer solution Commonly known as:  PROVENTIL Take 2.5 mg by nebulization every 6 (six) hours as needed for wheezing or shortness of breath.   simvastatin 40 MG tablet Commonly known as:  ZOCOR Take 40 mg  by mouth every evening.   sodium chloride 0.65 % Soln nasal spray Commonly known as:  OCEAN Place 1-2 sprays into both nostrils as needed for congestion.   tetrahydrozoline-zinc 0.05-0.25 % ophthalmic solution Commonly known as:  VISINE-AC Place 2 drops into both eyes 3 (three) times daily as needed (for redness or itching).   Vitamin D (Ergocalciferol) 1.25 MG (50000 UT) Caps capsule Commonly known as:  DRISDOL Take 50,000 Units by mouth every Monday.   zolpidem 10 MG tablet Commonly known as:  AMBIEN Take 5 mg by mouth at bedtime.        Brief H and P: For complete details please refer to admission H and P, but in brief Patient is a 63 year old female with asthma, hypertension, hyperlipidemia, GERD, anxiety presented to [place] with complaints of generalized weakness for last  2 weeks.  Patient had been having upper respiratory tract infection that was thought to be asthma exacerbation over the last 2 weeks, was seen repeatedly by her PCP and received multiple rounds of prednisone, Z-Pak and shots of antibiotics.  Patient has been using her nebulizer treatments as well.  Reports significant weakness and not feeling well, to the point that she was having difficulty with her ADLs.  She reported chest pain on the day of admission with dizziness.  No seizures.  In ED, patient was noted to have abnormal EKG with evidence of ventricular bigeminy's.  Cardiology was consulted by EDP, patient was sent to [facility] for further work-up  In ED, BP 219/92, temp 98.8, pulse 40-95, respiratory rate 26, O2 sats 96% on room air.  D-dimer 0.37, troponin negative.  BNP 29.3.  Chest x-ray showed no infiltrates.  Hospital Course:   Ventricular bigeminy/abnormal EKG -Reporting generalized weakness, no palpitations.  No prior history of cardiac disease, d-dimer negative.  Troponins x2 less than 0.03, BNP 29.3 -Possibly due to accelerated hypertension, acute bronchitis with frequent nebs -Repeat  EKG this morning shows normal sinus rhythm and no bigeminy's.   -2D echo showed EF of 60 to 65%, no wall motion abnormalities or valvular abnormalities -Cardiology was consulted and seen by Dr. Gwenlyn Found, likely patient symptoms were due to acute bronchitis and recent steroids.  Clear to discharge home, outpatient follow-up with cardiology, CTA coronary outpatient     Accelerated hypertension -Patient, BP has been running high in the last 1 week, possibly worsened due to acute bronchitis, decongestants, anxiety -Continue Norvasc 5 mg daily, lisinopril 40 mg daily, added Lopressor 12.5 mg twice daily -2D echo showed preserved EF    Acute bronchitis -No wheezing, likely has acute bronchitis, chest x-ray negative for any pneumonia.  Coughing during encounter. -Patient has been treated with rounds of steroids and antibiotics.  No fevers, leukocytosis likely due to recent steroids in the past week -Placed on symptomatic treatment with Tessalon Perles, Mucinex, Robitussin.  Added Claritin and Flonase.  Continue albuterol inhalers as needed and continue Dulera  Chronic mild intermittent asthma -Currently not in exacerbation -Continue albuterol as needed and Dulera twice daily    Seizure disorder (HCC) -No recent seizures, continue Keppra    GERD (gastroesophageal reflux disease) -Continue PPI    Hyperlipidemia, mixed - Continue Lipitor  Day of Discharge S: Feels better, excited to go home.  No chest pain, dizziness or shortness of breath.  BP (!) 159/86   Pulse 82   Temp 98.3 F (36.8 C) (Oral)   Resp 18   Ht 5\' 1"  (1.549 m)   Wt 89.2 kg   SpO2 99%   BMI 37.16 kg/m   Physical Exam: General: Alert and awake oriented x3 not in any acute distress. HEENT: anicteric sclera, pupils reactive to light and accommodation CVS: S1-S2 clear no murmur rubs or gallops Chest: clear to auscultation bilaterally, no wheezing rales or rhonchi Abdomen: soft nontender, nondistended, normal bowel  sounds Extremities: no cyanosis, clubbing or edema noted bilaterally Neuro: Cranial nerves II-XII intact, no focal neurological deficits   The results of significant diagnostics from this hospitalization (including imaging, microbiology, ancillary and laboratory) are listed below for reference.      Procedures/Studies: 2D echo 09/17/2018 IMPRESSIONS    1. The left ventricle has normal systolic function with an ejection fraction of 60-65%. The cavity size was normal. There is mildly increased left ventricular wall thickness. Left ventricular diastolic Doppler parameters are consistent with impaired  relaxation. Indeterminate filling pressures The E/e' is 8-15. No evidence of left ventricular regional wall motion abnormalities.  2. The right ventricle has normal systolic function. The cavity was normal. There is no increase in right ventricular wall thickness.  3. The mitral valve is abnormal. Mild thickening of the mitral valve leaflet.  4. No stenosis of the aortic valve.   Dg Chest 2 View  Result Date: 09/16/2018 CLINICAL DATA:  Chest pain EXAM: CHEST - 2 VIEW COMPARISON:  01/03/2016 FINDINGS: The cardiac silhouette is enlarged. There is no significant pleural effusion or area of consolidation. There is likely mild atelectasis or scarring at the lung bases bilaterally. IMPRESSION: No active cardiopulmonary disease. Cardiomegaly. Electronically Signed   By: Constance Holster M.D.   On: 09/16/2018 14:07      LAB RESULTS: Basic Metabolic Panel: Recent Labs  Lab 09/16/18 1421 09/16/18 2001 09/17/18 0147  NA 139  --  138  K 4.0  --  4.0  CL 103  --  105  CO2 24  --  24  GLUCOSE 97  --  111*  BUN 15  --  10  CREATININE 0.84 0.75 0.75  CALCIUM 9.6  --  9.1  MG 2.2  --   --    Liver Function Tests: Recent Labs  Lab 09/16/18 1421 09/17/18 0147  AST 25 19  ALT 30 27  ALKPHOS 63 51  BILITOT 0.7 0.6  PROT 7.4 6.0*  ALBUMIN 4.4 3.6   No results for input(s): LIPASE,  AMYLASE in the last 168 hours. No results for input(s): AMMONIA in the last 168 hours. CBC: Recent Labs  Lab 09/16/18 1421 09/16/18 2001 09/17/18 0147  WBC 13.4* 13.8* 10.9*  NEUTROABS 7.4  --   --   HGB 14.1 14.5 12.6  HCT 44.1 43.7 38.9  MCV 92.5 90.1 90.5  PLT 260 255 233   Cardiac Enzymes: Recent Labs  Lab 09/16/18 1421  TROPONINI <0.03   BNP: Invalid input(s): POCBNP CBG: Recent Labs  Lab 09/16/18 1427  GLUCAP 90      Disposition and Follow-up: Discharge Instructions    Diet - low sodium heart healthy   Complete by:  As directed    Increase activity slowly   Complete by:  As directed        DISPOSITION: home    [place]. Schedule an appointment as soon as possible for a visit in 2 week(s).   Why:  or tele visit  Contact information: [place] [address] [phone]        Lorretta Harp, MD. Schedule an appointment as soon as possible for a visit in 2 week(s).   Specialties:  Cardiology, Radiology Why:  or tele visit  Contact information: [address] [place] [address] [phone]            Time coordinating discharge:  21mins   Signed:   Estill Cotta M.D. [facility] 09/17/2018, 1:51 PM

## 2018-09-17 NOTE — Consult Note (Signed)
Cardiology Consultation:   Patient ID: Monica Booth MRN: [id number]; DOB: [birth year]  Admit date: 09/16/2018 Date of Consult: 09/17/2018  Primary Care Provider: [place] Primary Cardiologist: No primary care provider on file. new Primary Electrophysiologist:  None    Patient Profile:   Monica Booth is a 63 y.o. female with a hx of asthma, HTN, HLD, Seizures and anxiety disorder who is being seen today for the evaluation of bigeminy PVCs at the request of Dr. Tana Coast on pt admitted with weakness.  History of Present Illness:   Monica Booth with no prior cardiac hx except HTN presented to ER yesterday with weakness for 1-2 weeks.  Recently had URI and treated with steroids and z pak.  She has had weakness al over her body.  Her weakness is significant enough to disrupt her ADLs.  She also has had chest pain and some dizziness.  No recent seizures.  No chest pain on admit.  She has been afebrile.   She did have cardiac cath 08/2012 at [facility] with normal coronary arteries.    Pt has had URI and asthma issues recently and has had ABX and steroids.   Her weakness has increased and SOB along with some mild chest discomfort.  Lt ant discomfort without radiation and does not last long. But occurs with activity and at times at rest.  She has trouble articulating her symptoms.  She has also had knee pain recently.  She may feel skipped beats but not sure.    EKG:  The EKG was personally reviewed and demonstrates:  SR with bigeminy PVCs and low voltage in precordial leads, today SR and normal EKG.   Telemetry:  Telemetry was personally reviewed and demonstrates:  SR  Troponin neg X 1 <0.03 BNP 29.3 Na 138, K+ 4.0 Cr 0.75 LFTs normal WBC 13.4 on admit now 10.9  hgb 14.1 Plts 260 TSH 3.966  2V CXR and no cardiopulmonary abnormalities.   Echo has been done and pending Currently with elevated BP and not orthostatic though will being up per RN she is weak and feels faint.  No pain or SOB now.    Past Medical History:  Diagnosis Date  . Anxiety   . Asthma   . GERD (gastroesophageal reflux disease)   . GI problem    freq n/v  . Hyperlipemia   . Hypertension   . Seizures ([place])   . Skin cancer     Past Surgical History:  Procedure Laterality Date  . ABDOMINAL HYSTERECTOMY    . BRAIN SURGERY    . CHOLECYSTECTOMY    . COLONOSCOPY    . CRANIOTOMY  2014   [facility]-frontal lobe  . HERNIA REPAIR     umb  . SKIN SPLIT GRAFT Right 03/31/2014   Procedure: ADJACENT TISSUE TRANSFER RIGHT CHEEK;  Surgeon: Irene Limbo, MD;  Location: [place];  Service: Plastics;  Laterality: Right;  . UPPER GI ENDOSCOPY       Home Medications:  Prior to Admission medications   Medication Sig Start Date End Date Taking? Authorizing Provider  acetaminophen (TYLENOL) 500 MG tablet Take 1,000 mg by mouth every 8 (eight) hours as needed for mild pain or headache.    Yes [provider]  albuterol (PROAIR HFA) 108 (90 Base) MCG/ACT inhaler Inhale 2 puffs into the lungs every 6 (six) hours as needed for wheezing or shortness of breath.   Yes [provider]  albuterol (PROVENTIL) (2.5 MG/3ML) 0.083% nebulizer solution Take 2.5 mg  by nebulization every 6 (six) hours as needed for wheezing or shortness of breath.   Yes [provider]  amLODipine (NORVASC) 5 MG tablet Take 5 mg by mouth daily.   Yes [provider]  calcium carbonate (TUMS EX) 750 MG chewable tablet Chew 1-2 tablets by mouth as needed for heartburn.    Yes [provider]  cetirizine (ZYRTEC) 10 MG tablet Take 10 mg by mouth daily as needed for allergies or rhinitis.    Yes [provider]  clonazePAM (KLONOPIN) 0.5 MG tablet Take 0.5 mg by mouth daily at 12 noon.    Yes [provider]  esomeprazole (NEXIUM) 40 MG capsule Take 40 mg by mouth daily.   Yes [provider]  fluticasone (FLONASE) 50 MCG/ACT nasal spray Place 2 sprays into both  nostrils daily as needed for allergies or rhinitis.    Yes [provider]  Fluticasone-Salmeterol (ADVAIR DISKUS) 250-50 MCG/DOSE AEPB Inhale 1 puff into the lungs 2 (two) times daily.   Yes [provider]  levETIRAcetam (KEPPRA) 500 MG tablet Take 500 mg by mouth 2 (two) times daily.   Yes [provider]  lisinopril (PRINIVIL,ZESTRIL) 40 MG tablet Take 40 mg by mouth daily.   Yes [provider]  Multiple Vitamins-Minerals (ONE-A-DAY WOMENS 50+ ADVANTAGE) TABS Take 1 tablet by mouth daily with breakfast.   Yes [provider]  ondansetron (ZOFRAN ODT) 4 MG disintegrating tablet Take 1 tablet (4 mg total) by mouth every 8 (eight) hours as needed for nausea or vomiting. 10/13/17  Yes Carlisle Cater, PA-C  simvastatin (ZOCOR) 40 MG tablet Take 40 mg by mouth every evening.    Yes [provider]  sodium chloride (OCEAN) 0.65 % SOLN nasal spray Place 1-2 sprays into both nostrils as needed for congestion.   Yes [provider]  tetrahydrozoline-zinc (VISINE-AC) 0.05-0.25 % ophthalmic solution Place 2 drops into both eyes 3 (three) times daily as needed (for redness or itching).   Yes [provider]  Vitamin D, Ergocalciferol, (DRISDOL) 50000 UNITS CAPS capsule Take 50,000 Units by mouth every Monday.    Yes [provider]  zolpidem (AMBIEN) 10 MG tablet Take 5 mg by mouth at bedtime.    Yes [provider]  Potassium 99 MG TABS Take 99 mg by mouth daily as needed (for feeling weak).    [provider]    Inpatient Medications: Scheduled Meds: . amLODipine  5 mg Oral Daily  . atorvastatin  20 mg Oral q1800  . benzonatate  100 mg Oral TID  . clonazePAM  0.5 mg Oral Q1200  . enoxaparin (LOVENOX) injection  40 mg Subcutaneous Q24H  . fluticasone  2 spray Each Nare Daily  . guaiFENesin  1,200 mg Oral BID  . levETIRAcetam  500 mg Oral BID  . lisinopril  40 mg Oral Daily  . loratadine  10 mg Oral  Daily  . metoprolol tartrate  12.5 mg Oral BID  . mometasone-formoterol  2 puff Inhalation BID  . multivitamin with minerals  1 tablet Oral Q breakfast  . pantoprazole  40 mg Oral Daily  . [START ON 09/20/2018] Vitamin D (Ergocalciferol)  50,000 Units Oral Q Mon  . zolpidem  5 mg Oral QHS   Continuous Infusions: . sodium chloride 75 mL/hr at 09/17/18 0945   PRN Meds: acetaminophen, albuterol, calcium carbonate, guaiFENesin-dextromethorphan, hydrALAZINE, naphazoline-glycerin, ondansetron **OR** ondansetron (ZOFRAN) IV, sodium chloride  Allergies:    Allergies  Allergen Reactions  .  Cheese Nausea Only and Other (See Comments)    "GI upset," PER ALLERGY TEST  . Yeast Nausea Only and Other (See Comments)    "GI upset," PER ALLERGY TEST    Social History:   Social History   Socioeconomic History  . Marital status: Single    Spouse name: Not on file  . Number of children: Not on file  . Years of education: Not on file  . Highest education level: Not on file  Occupational History  . Not on file  Social Needs  . Financial resource strain: Not on file  . Food insecurity:    Worry: Not on file    Inability: Not on file  . Transportation needs:    Medical: Not on file    Non-medical: Not on file  Tobacco Use  . Smoking status: Former Smoker    Last attempt to quit: 03/30/1989    Years since quitting: 29.4  . Smokeless tobacco: Never Used  Substance and Sexual Activity  . Alcohol use: No  . Drug use: No  . Sexual activity: Not on file  Lifestyle  . Physical activity:    Days per week: Not on file    Minutes per session: Not on file  . Stress: Not on file  Relationships  . Social connections:    Talks on phone: Not on file    Gets together: Not on file    Attends religious service: Not on file    Active member of club or organization: Not on file    Attends meetings of clubs or organizations: Not on file    Relationship status: Not on file  . Intimate partner  violence:    Fear of current or ex partner: Not on file    Emotionally abused: Not on file    Physically abused: Not on file    Forced sexual activity: Not on file  Other Topics Concern  . Not on file  Social History Narrative  . Not on file    Family History:    Family History  Problem Relation Age of Onset  . Heart disease Mother 71  . Heart disease Brother      ROS:  Please see the history of present illness.  General:no colds or fevers, no weight changes Skin:no rashes or ulcers HEENT:no blurred vision, no congestion CV:see HPI PUL:see HPI GI:no diarrhea constipation or melena, no indigestion GU:hx of microscopic hematuria, no dysuria MS:+ knee pain with steroid injection, no claudication Neuro:no syncope, no lightheadedness, no recent seizures  Endo:no diabetes, no thyroid disease  All other ROS reviewed and negative.     Physical Exam/Data:   Vitals:   09/16/18 1949 09/16/18 2328 09/17/18 0752 09/17/18 1043  BP: (!) 167/75 (!) 157/71 (!) 160/104 (!) 166/71  Pulse: 77 76 98 82  Resp: 15 16  18   Temp: 97.7 F (36.5 C) 97.9 F (36.6 C) (!) 97.5 F (36.4 C) 98.3 F (36.8 C)  TempSrc: Oral Oral Oral Oral  SpO2: 96% 95% 99% 99%  Weight:      Height:        Intake/Output Summary (Last 24 hours) at 09/17/2018 1218 Last data filed at 09/17/2018 1212 Gross per 24 hour  Intake 1861.63 ml  Output 1000 ml  Net 861.63 ml   Last 3 Weights 09/16/2018 09/16/2018 10/12/2017  Weight (lbs) 196 lb 10.4 oz 200 lb 180 lb  Weight (kg) 89.2 kg 90.719 kg 81.647 kg     Body mass index is  37.16 kg/m.  Per MD Dr. Gwenlyn Found General:  Well nourished, well developed, in no acute distress HEENT: normal Lymph: no adenopathy Neck: no JVD Endocrine:  No thryomegaly Vascular: No carotid bruits; FA pulses 2+ bilaterally without bruits  Cardiac:  normal S1, S2; RRR; no murmur  Lungs:  clear to auscultation bilaterally, no wheezing, rhonchi or rales  Abd: soft, nontender, no hepatomegaly   Ext: no edema Musculoskeletal:  No deformities, BUE and BLE strength normal and equal Skin: warm and dry  Neuro:  CNs 2-12 intact, no focal abnormalities noted Psych:  Normal affect   Relevant CV Studies: Echo pending   Laboratory Data:  Chemistry Recent Labs  Lab 09/16/18 1421 09/16/18 2001 09/17/18 0147  NA 139  --  138  K 4.0  --  4.0  CL 103  --  105  CO2 24  --  24  GLUCOSE 97  --  111*  BUN 15  --  10  CREATININE 0.84 0.75 0.75  CALCIUM 9.6  --  9.1  GFRNONAA >60 >60 >60  GFRAA >60 >60 >60  ANIONGAP 12  --  9    Recent Labs  Lab 09/16/18 1421 09/17/18 0147  PROT 7.4 6.0*  ALBUMIN 4.4 3.6  AST 25 19  ALT 30 27  ALKPHOS 63 51  BILITOT 0.7 0.6   Hematology Recent Labs  Lab 09/16/18 1421 09/16/18 2001 09/17/18 0147  WBC 13.4* 13.8* 10.9*  RBC 4.77 4.85 4.30  HGB 14.1 14.5 12.6  HCT 44.1 43.7 38.9  MCV 92.5 90.1 90.5  MCH 29.6 29.9 29.3  MCHC 32.0 33.2 32.4  RDW 13.6 13.5 13.6  PLT 260 255 233   Cardiac Enzymes Recent Labs  Lab 09/16/18 1421  TROPONINI <0.03   No results for input(s): TROPIPOC in the last 168 hours.  BNP Recent Labs  Lab 09/16/18 1421  BNP 29.3    DDimer  Recent Labs  Lab 09/16/18 1511  DDIMER 0.37    Radiology/Studies:  Dg Chest 2 View  Result Date: 09/16/2018 CLINICAL DATA:  Chest pain EXAM: CHEST - 2 VIEW COMPARISON:  01/03/2016 FINDINGS: The cardiac silhouette is enlarged. There is no significant pleural effusion or area of consolidation. There is likely mild atelectasis or scarring at the lung bases bilaterally. IMPRESSION: No active cardiopulmonary disease. Cardiomegaly. Electronically Signed   By: Constance Holster M.D.   On: 09/16/2018 14:07    Assessment and Plan:   1. Weakness and chest pain and bigeminy PVCs.  Neg troponin, no St changes on EKG, TSH is normal.  Echo is pending. outpt event monitor to eval PVC burden.  Would also question need for COVID test with URI and weakness.  Dr. Gwenlyn Found to eval.  May  need cardiac CTA to eval coronary arteries.  She does have risk of FH, HTN, HLD but normal cath in 2014.   2. HTN elevated today on ACE and amlodipine as outpt 3. Hx HLD on zocor 4. Hx of seizures       For questions or updates, please contact [place] Please consult [email or web address] for contact info under     Signed, Cecilie Kicks, NP  09/17/2018 12:18 PM   Agree with note written by Cecilie Kicks RNP  We were asked to see Monica Booth by Dr. Tana Coast because of atypical chest pain and PVCs.  She does have risk factors included hypertension and hyperlipidemia as well as family history.  A longer younger brothers had stents.  She has reactive airways disease  and does great of risk for tract infections.  She had a cath several years ago back in 2014 which was normal.  She is felt weak for the last couple weeks.  She was placed on oral prednisone.  She did have some PVCs on admission.  Enzymes are negative.  2D echo is pending.  Her symptoms are atypical.  Agree with addition of low-dose beta-blocker which can treat her blood pressure as well.  I suspect her PVCs and hypertension were related to her steroid use.  Her exam is benign.  I do not think she needs an inpatient work-up.  We can arrange outpatient coronary CTA to further evaluate but I suspect this will be normal.  Okay to discharge home from our point of view.   Quay Burow 09/17/2018 1:33 PM

## 2018-09-17 NOTE — Progress Notes (Signed)
[facility]                                                                              Patient Demographics  Monica Booth, is a 63 y.o. female, DOB - [birth year], ASN:[id number]  Admit date - 09/16/2018   Admitting Physician Flora Lipps, MD  Outpatient Primary [facility] for the [facility] is [facility] [place]  Outpatient specialists:   LOS - 0  days   Medical records reviewed and are as summarized below:    Chief Complaint  Patient presents with  . Weakness       Brief summary   Patient is a 63 year old female with asthma, hypertension, hyperlipidemia, GERD, anxiety presented to [facility] with complaints of generalized weakness for last  2 weeks.  Patient had been having upper respiratory tract infection that was thought to be asthma exacerbation over the last 2 weeks, was seen repeatedly by her PCP and received multiple rounds of prednisone, Z-Pak and shots of antibiotics.  Patient has been using her nebulizer treatments as well.  Reports significant weakness and not feeling well, to the point that she was having difficulty with her ADLs.  She reported chest pain on the day of admission with dizziness.  No seizures.  In ED, patient was noted to have abnormal EKG with evidence of ventricular bigeminy's.  Cardiology was consulted by EDP, patient was sent to [facility] for further work-up  In ED, BP 219/92, temp 98.8, pulse 40-95, respiratory rate 26, O2 sats 96% on room air.  D-dimer 0.37, troponin negative.  BNP 29.3.  Chest x-ray showed no infiltrates.   Assessment & Plan    Principal Problem:   Ventricular bigeminy/abnormal EKG -Reporting generalized weakness, no palpitations.  No prior history of cardiac disease, d-dimer negative.  Troponins x2 less than 0.03, BNP 29.3 -Possibly due to accelerated hypertension, acute bronchitis with frequent nebs -Repeat EKG this morning shows normal sinus rhythm and no bigeminy's.  Cardiology has been consulted, awaiting  evaluation. -Follow 2D echo -If no inpatient cardiac work-up planned today, possible DC home later if cleared by cardiology  Active Problems:   Accelerated hypertension -Patient, BP has been running high in the last 1 week, possibly worsened due to acute bronchitis, decongestants, anxiety -Continue Norvasc 5 mg daily, lisinopril 40 mg daily, added Lopressor 12.5 mg twice daily -Hydralazine IV as needed with parameters -Follow 2D echo    Acute bronchitis -No wheezing, likely has acute bronchitis, chest x-ray negative for any pneumonia.  Coughing during encounter. -Patient has been treated with rounds of steroids and antibiotics.  No fevers, leukocytosis likely due to recent steroids in the past week -Will place on symptomatic treatment with Tessalon Perles, Mucinex, Robitussin.  Added Claritin and Flonase.  Continue albuterol inhalers as needed and continue Dulera  Chronic mild intermittent asthma -Currently not in exacerbation -Continue albuterol as needed and Dulera twice daily    Seizure disorder (HCC) -No recent seizures, continue Keppra    GERD (gastroesophageal reflux disease) -Continue PPI    Hyperlipidemia, mixed - Continue Lipitor   Code Status: Full CODE STATUS DVT Prophylaxis:  Lovenox  Family Communication: Discussed in  detail with the patient, all imaging results, lab results explained to the patient    Disposition Plan: Hopefully DC home later today if cleared by cardiology  Time Spent in minutes   35 minutes  Procedures:  None   Consultants:   cardiology  Antimicrobials:   Anti-infectives (From admission, onward)   None         Medications  Scheduled Meds: . amLODipine  5 mg Oral Daily  . atorvastatin  20 mg Oral q1800  . benzonatate  100 mg Oral TID  . clonazePAM  0.5 mg Oral Q1200  . enoxaparin (LOVENOX) injection  40 mg Subcutaneous Q24H  . fluticasone  2 spray Each Nare Daily  . guaiFENesin  1,200 mg Oral BID  . levETIRAcetam  500 mg  Oral BID  . lisinopril  40 mg Oral Daily  . loratadine  10 mg Oral Daily  . metoprolol tartrate  12.5 mg Oral BID  . mometasone-formoterol  2 puff Inhalation BID  . multivitamin with minerals  1 tablet Oral Q breakfast  . pantoprazole  40 mg Oral Daily  . [START ON 09/20/2018] Vitamin D (Ergocalciferol)  50,000 Units Oral Q Mon  . zolpidem  5 mg Oral QHS   Continuous Infusions: . sodium chloride 75 mL/hr at 09/17/18 0945   PRN Meds:.acetaminophen, albuterol, calcium carbonate, guaiFENesin-dextromethorphan, naphazoline-glycerin, ondansetron **OR** ondansetron (ZOFRAN) IV, sodium chloride      Subjective:   Monica Booth was seen and examined today.  BP elevated, coughing during encounter. No fevers or wheezing.  Still feels bit headache and dizziness with elevated BP.   Patient denies chest pain, shortness of breath, abdominal pain, N/V/D/C, new weakness, numbess, tingling.  Objective:   Vitals:   09/16/18 1836 09/16/18 1949 09/16/18 2328 09/17/18 0752  BP: (!) 165/96 (!) 167/75 (!) 157/71 (!) 160/104  Pulse: 80 77 76 98  Resp: 17 15 16    Temp: 98.8 F (37.1 C) 97.7 F (36.5 C) 97.9 F (36.6 C) (!) 97.5 F (36.4 C)  TempSrc: Oral Oral Oral Oral  SpO2: 97% 96% 95% 99%  Weight: 89.2 kg     Height: 5\' 1"  (1.549 m)       Intake/Output Summary (Last 24 hours) at 09/17/2018 0957 Last data filed at 09/17/2018 0809 Gross per 24 hour  Intake 1621.63 ml  Output 600 ml  Net 1021.63 ml     Wt Readings from Last 3 Encounters:  09/16/18 89.2 kg  10/12/17 81.6 kg  12/13/16 81.6 kg     Exam  General: Alert and oriented x 3, NAD, coughing during encounter  Eyes:   HEENT:  Atraumatic, normocephalic,   Cardiovascular: S1 S2 auscultated,  Regular rate and rhythm.  Respiratory: Clear to auscultation bilaterally, no wheezing or rhonchi  Gastrointestinal: Soft, nontender, nondistended, + bowel sounds  Ext: no pedal edema bilaterally  Neuro: No new deficit  Musculoskeletal:  No digital cyanosis, clubbing  Skin: No rashes  Psych: Normal affect and demeanor, alert and oriented x3    Data Reviewed:  I have personally reviewed following labs and imaging studies  Micro Results No results found for this or any previous visit (from the past 240 hour(s)).  Radiology Reports Dg Chest 2 View  Result Date: 09/16/2018 CLINICAL DATA:  Chest pain EXAM: CHEST - 2 VIEW COMPARISON:  01/03/2016 FINDINGS: The cardiac silhouette is enlarged. There is no significant pleural effusion or area of consolidation. There is likely mild atelectasis or scarring at the lung bases bilaterally. IMPRESSION: No active  cardiopulmonary disease. Cardiomegaly. Electronically Signed   By: Constance Holster M.D.   On: 09/16/2018 14:07    Lab Data:  CBC: Recent Labs  Lab 09/16/18 1421 09/16/18 2001 09/17/18 0147  WBC 13.4* 13.8* 10.9*  NEUTROABS 7.4  --   --   HGB 14.1 14.5 12.6  HCT 44.1 43.7 38.9  MCV 92.5 90.1 90.5  PLT 260 255 347   Basic Metabolic Panel: Recent Labs  Lab 09/16/18 1421 09/16/18 2001 09/17/18 0147  NA 139  --  138  K 4.0  --  4.0  CL 103  --  105  CO2 24  --  24  GLUCOSE 97  --  111*  BUN 15  --  10  CREATININE 0.84 0.75 0.75  CALCIUM 9.6  --  9.1  MG 2.2  --   --    GFR: Estimated Creatinine Clearance: 75.1 mL/min (by C-G formula based on SCr of 0.75 mg/dL). Liver Function Tests: Recent Labs  Lab 09/16/18 1421 09/17/18 0147  AST 25 19  ALT 30 27  ALKPHOS 63 51  BILITOT 0.7 0.6  PROT 7.4 6.0*  ALBUMIN 4.4 3.6   No results for input(s): LIPASE, AMYLASE in the last 168 hours. No results for input(s): AMMONIA in the last 168 hours. Coagulation Profile: No results for input(s): INR, PROTIME in the last 168 hours. Cardiac Enzymes: Recent Labs  Lab 09/16/18 1421  TROPONINI <0.03   BNP (last 3 results) No results for input(s): PROBNP in the last 8760 hours. HbA1C: No results for input(s): HGBA1C in the last 72 hours. CBG: Recent Labs   Lab 09/16/18 1427  GLUCAP 90   Lipid Profile: No results for input(s): CHOL, HDL, LDLCALC, TRIG, CHOLHDL, LDLDIRECT in the last 72 hours. Thyroid Function Tests: No results for input(s): TSH, T4TOTAL, FREET4, T3FREE, THYROIDAB in the last 72 hours. Anemia Panel: No results for input(s): VITAMINB12, FOLATE, FERRITIN, TIBC, IRON, RETICCTPCT in the last 72 hours. Urine analysis:    Component Value Date/Time   COLORURINE YELLOW 09/16/2018 1400   APPEARANCEUR CLEAR 09/16/2018 1400   LABSPEC 1.010 09/16/2018 1400   PHURINE 7.0 09/16/2018 1400   GLUCOSEU NEGATIVE 09/16/2018 1400   HGBUR TRACE (A) 09/16/2018 1400   BILIRUBINUR NEGATIVE 09/16/2018 1400   KETONESUR NEGATIVE 09/16/2018 1400   PROTEINUR NEGATIVE 09/16/2018 1400   UROBILINOGEN 0.2 02/09/2014 2234   NITRITE NEGATIVE 09/16/2018 1400   LEUKOCYTESUR NEGATIVE 09/16/2018 1400     Massimiliano Rohleder M.D. [facility] 09/17/2018, 9:57 AM  Pager: [phone] Between 7am to 7pm - call Pager - [phone]  After 7pm go to [email or web address] - password [email or web address]  Call night coverage person covering after 7pm

## 2018-09-17 NOTE — Plan of Care (Signed)
  Problem: Clinical Measurements: Goal: Ability to maintain clinical measurements within normal limits will improve Outcome: Progressing Goal: Will remain free from infection Outcome: Progressing Goal: Respiratory complications will improve Outcome: Progressing Goal: Cardiovascular complication will be avoided Outcome: Progressing   Problem: Nutrition: Goal: Adequate nutrition will be maintained Outcome: Progressing   Problem: Coping: Goal: Level of anxiety will decrease Outcome: Progressing   Problem: Elimination: Goal: Will not experience complications related to bowel motility Outcome: Progressing Goal: Will not experience complications related to urinary retention Outcome: Progressing   Problem: Safety: Goal: Ability to remain free from injury will improve Outcome: Progressing

## 2018-09-17 NOTE — Discharge Instructions (Signed)
YOUR CARDIOLOGY TEAM HAS ARRANGED FOR AN E-VISIT FOR YOUR APPOINTMENT - PLEASE REVIEW IMPORTANT INFORMATION BELOW SEVERAL DAYS PRIOR TO YOUR APPOINTMENT  Due to the recent COVID-19 pandemic, we are transitioning in-person office visits to tele-medicine visits in an effort to decrease unnecessary exposure to our patients, their families, and staff. These visits are billed to your insurance just like a normal visit is. We also encourage you to sign up for MyChart if you have not already done so. You will need a smartphone if possible. For patients that do not have this, we can still complete the visit using a regular telephone but do prefer a smartphone to enable video when possible. You may have a family member that lives with you that can help. If possible, we also ask that you have a blood pressure cuff and scale at home to measure your blood pressure, heart rate and weight prior to your scheduled appointment. Patients with clinical needs that need an in-person evaluation and testing will still be able to come to the office if absolutely necessary. If you have any questions, feel free to call our office.     YOUR PROVIDER WILL BE USING THE FOLLOWING PLATFORM TO COMPLETE YOUR VISIT: Staff: Please delete this text and fill in MyChart/Doximity/[email or web address]   IF USING MYCHART - How to Download the MyChart App to Your SmartPhone   - If Apple, go to [employer] and type in MyChart in the search bar and download the app. If Android, ask patient to go to [employer] and type in Happy in the search bar and download the app. The app is free but as with any other app downloads, your phone may require you to verify saved payment information or Apple/Android password.  - You will need to then log into the app with your MyChart username and password, and select [facility] as your healthcare provider to link the account.  - When it is time for your visit, go to the MyChart app, find appointments, and click Begin  Video Visit. Be sure to Select Allow for your device to access the Microphone and Camera for your visit. You will then be connected, and your provider will be with you shortly.  **If you have any issues connecting or need assistance, please contact MyChart service desk [phone])**  **If using a computer, in order to ensure the best quality for your visit, you will need to use either of the following Internet Browsers: Insurance underwriter or Microsoft Edge**   IF USING DOXIMITY or [email or web address] - The staff will give you instructions on receiving your link to join the meeting the day of your visit.      2-3 DAYS BEFORE YOUR APPOINTMENT  You will receive a telephone call from one of our [facility] team members - your caller ID may say "Unknown caller." If this is a video visit, we will walk you through how to get the video launched on your phone. We will remind you check your blood pressure, heart rate and weight prior to your scheduled appointment. If you have an Apple Watch or Kardia, please upload any pertinent ECG strips the day before or morning of your appointment to [facility]. Our staff will also make sure you have reviewed the consent and agree to move forward with your scheduled tele-health visit.     THE DAY OF YOUR APPOINTMENT  Approximately 15 minutes prior to your scheduled appointment, you will receive a telephone call from one of [facility] team -  your caller ID may say "Unknown caller."  Our staff will confirm medications, vital signs for the day and any symptoms you may be experiencing. Please have this information available prior to the time of visit start. It may also be helpful for you to have a pad of paper and pen handy for any instructions given during your visit. They will also walk you through joining the smartphone meeting if this is a video visit.    CONSENT FOR TELE-HEALTH VISIT - PLEASE REVIEW  I hereby voluntarily request, consent and authorize [facility] and  its employed or contracted physicians, physician assistants, nurse practitioners or other licensed health care professionals (the Practitioner), to provide me with telemedicine health care services (the Services") as deemed necessary by the treating Practitioner. I acknowledge and consent to receive the Services by the Practitioner via telemedicine. I understand that the telemedicine visit will involve communicating with the Practitioner through live audiovisual communication technology and the disclosure of certain medical information by electronic transmission. I acknowledge that I have been given the opportunity to request an in-person assessment or other available alternative prior to the telemedicine visit and am voluntarily participating in the telemedicine visit.  I understand that I have the right to withhold or withdraw my consent to the use of telemedicine in the course of my care at any time, without affecting my right to future care or treatment, and that the Practitioner or I may terminate the telemedicine visit at any time. I understand that I have the right to inspect all information obtained and/or recorded in the course of the telemedicine visit and may receive copies of available information for a reasonable fee.  I understand that some of the potential risks of receiving the Services via telemedicine include:   Delay or interruption in medical evaluation due to technological equipment failure or disruption;  Information transmitted may not be sufficient (e.g. poor resolution of images) to allow for appropriate medical decision making by the Practitioner; and/or   In rare instances, security protocols could fail, causing a breach of personal health information.  Furthermore, I acknowledge that it is my responsibility to provide information about my medical history, conditions and care that is complete and accurate to the best of my ability. I acknowledge that Practitioner's advice,  recommendations, and/or decision may be based on factors not within their control, such as incomplete or inaccurate data provided by me or distortions of diagnostic images or specimens that may result from electronic transmissions. I understand that the practice of medicine is not an exact science and that Practitioner makes no warranties or guarantees regarding treatment outcomes. I acknowledge that I will receive a copy of this consent concurrently upon execution via email to the email address I last provided but may also request a printed copy by calling the office of [facility].    I understand that my insurance will be billed for this visit.   I have read or had this consent read to me.  I understand the contents of this consent, which adequately explains the benefits and risks of the Services being provided via telemedicine.   I have been provided ample opportunity to ask questions regarding this consent and the Services and have had my questions answered to my satisfaction.  I give my informed consent for the services to be provided through the use of telemedicine in my medical care  By participating in this telemedicine visit I agree to the above.

## 2018-09-17 NOTE — Progress Notes (Signed)
Pt c/o feeling wobbly on standing and dizziness  , alwso a slight headache, On check of orthstatics.

## 2018-09-22 ENCOUNTER — Telehealth: Payer: Self-pay | Admitting: Cardiovascular Disease

## 2018-09-22 NOTE — Telephone Encounter (Signed)
New Message       Pt is calling because she said she feels weak since she got out of the hospital. She says she doesn't know if she is suppose to be taking 3 different kinds of BP meds   Please call

## 2018-09-22 NOTE — Telephone Encounter (Signed)
Spoke with pt and feels weak since hospital discharge Pt had Dr's appt today and forgot to inform them of this feeling Per pt B/P was 120/?  Pt states normal for her  is 140-150/ Encouraged pt to check B/P and HR a couple of times a day and have those readings available for Dr Kennon Holter appt on Tuesday verbalized understanding Also instructed pt take Amlodipine 5 mg and Metoprolol 12.5 mg in am and to take Lisinopril 40 mg and the Metoprolol in pm and see if this helps with weakness.Pt was taking Amlodipine and Lisinopril in am and then hour or so later taking Metoprolol .Pt will try this. Will forward to Dr Gwenlyn Found for review and recommendations ./cy

## 2018-09-23 NOTE — Telephone Encounter (Signed)
Will reassess when I see her and see how she is feeling with the change in medication schedule.

## 2018-09-27 ENCOUNTER — Telehealth: Payer: Self-pay | Admitting: Cardiovascular Disease

## 2018-09-28 ENCOUNTER — Telehealth: Payer: Self-pay

## 2018-09-28 ENCOUNTER — Telehealth (INDEPENDENT_AMBULATORY_CARE_PROVIDER_SITE_OTHER): Payer: 59 | Admitting: Cardiovascular Disease

## 2018-09-28 DIAGNOSIS — E782 Mixed hyperlipidemia: Secondary | ICD-10-CM

## 2018-09-28 DIAGNOSIS — I10 Essential (primary) hypertension: Secondary | ICD-10-CM

## 2018-09-28 NOTE — Telephone Encounter (Signed)
Called patient to follow up from telehealth visit from earlier today. No answer, left message to call back.

## 2018-09-28 NOTE — Progress Notes (Signed)
I was doing a virtual telemedicine phone visit with Ms. Gadson today.  I was going through routine background questions such as her marital status, whether she has children and whether she works.  She told me she was disabled and I asked her why?  She then proceeded to tell me she did not appreciate the questions and she hung up on me.  I tried to call her back and she did not answer.  Lorretta Harp, M.D., [place], [facility], [place] [address]. [place],   [address]  [phone] 09/28/2018 1:49 PM

## 2018-11-09 NOTE — Telephone Encounter (Signed)
Opened in error

## 2020-05-22 ENCOUNTER — Other Ambulatory Visit: Payer: Self-pay | Admitting: Physician Assistant

## 2020-05-22 DIAGNOSIS — I83893 Varicose veins of bilateral lower extremities with other complications: Secondary | ICD-10-CM

## 2020-11-23 IMAGING — DX CHEST - 2 VIEW
2 series · 2 of 2 positions shown · non-contrast
Comparison: 01/03/2016

CLINICAL DATA: Chest pain

EXAM:
CHEST - 2 VIEW

[chest pa]
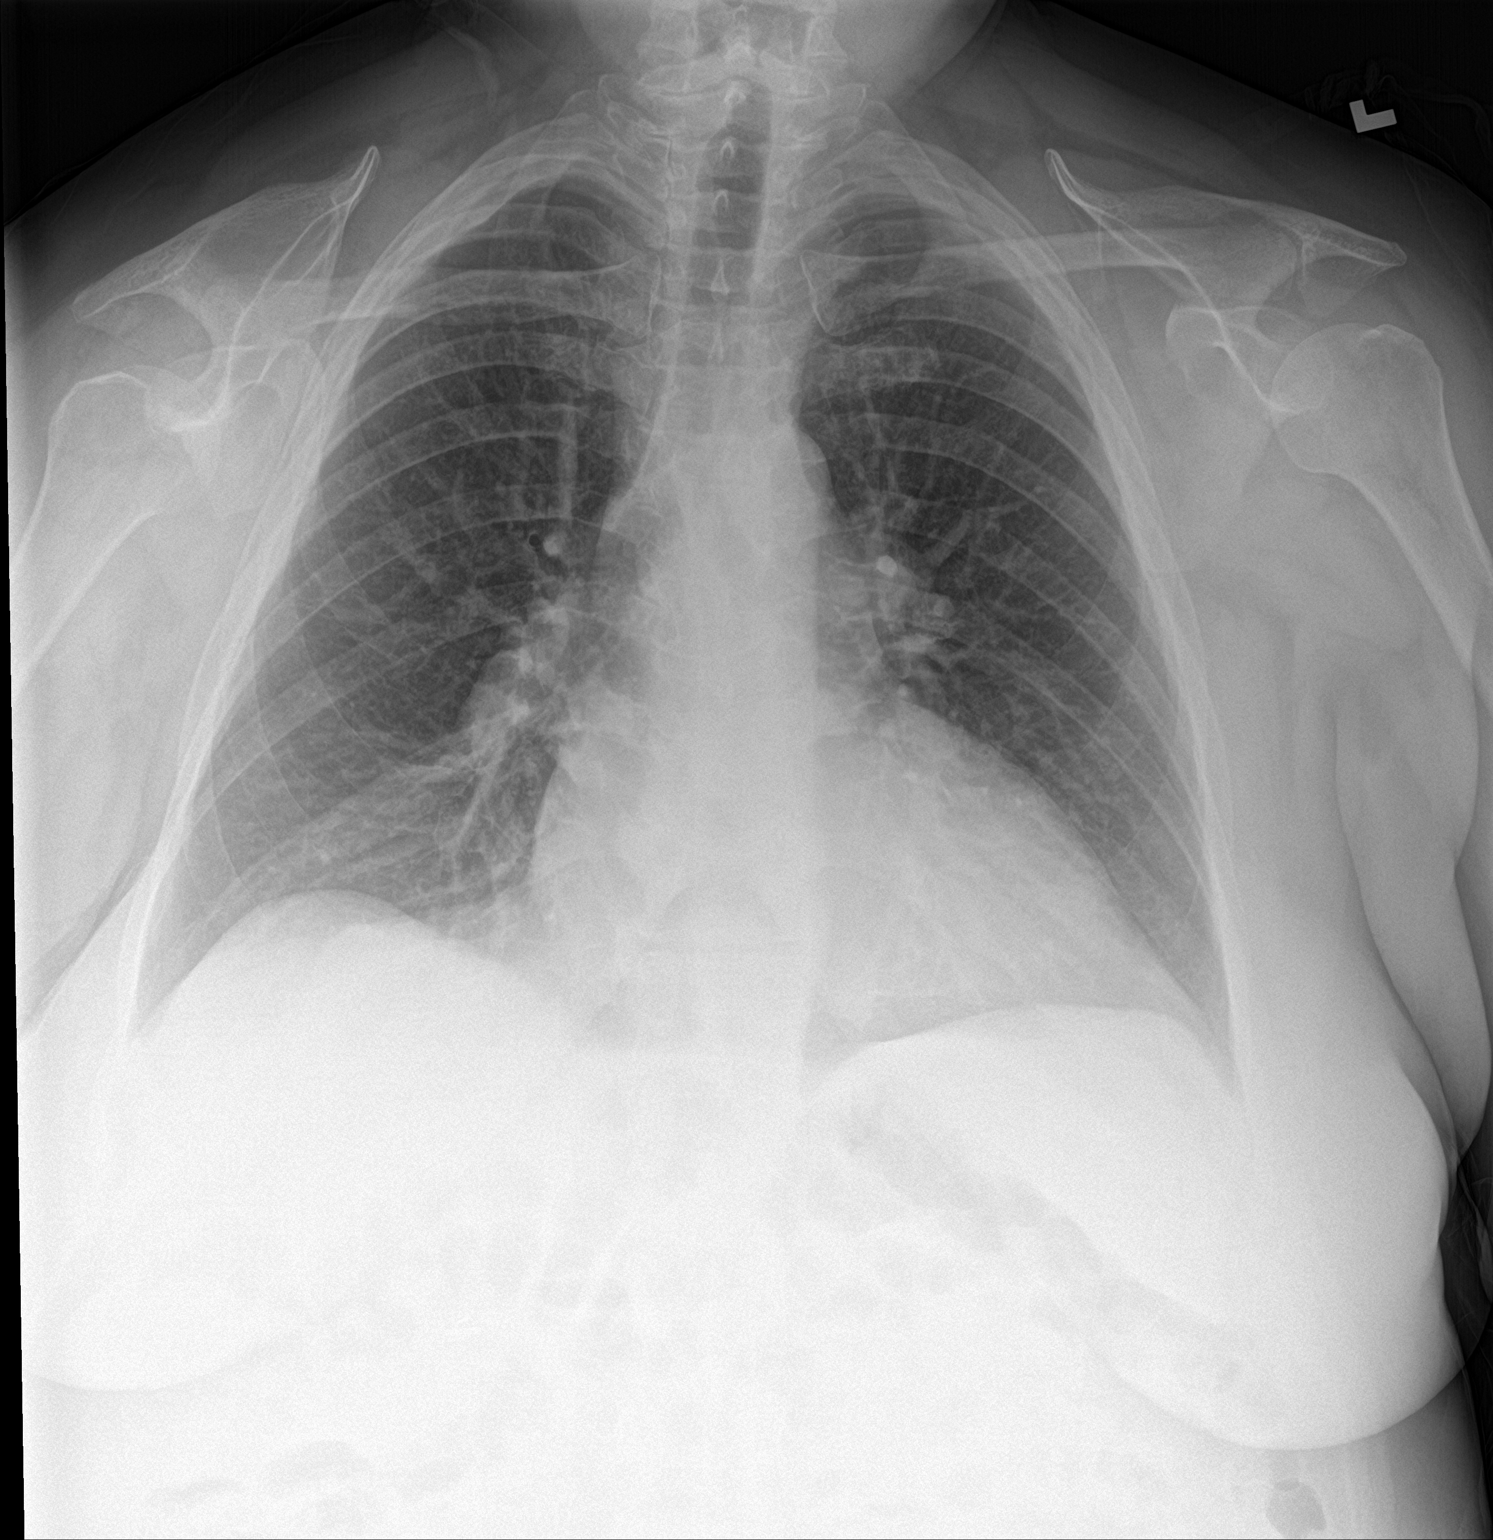

[chest lat]
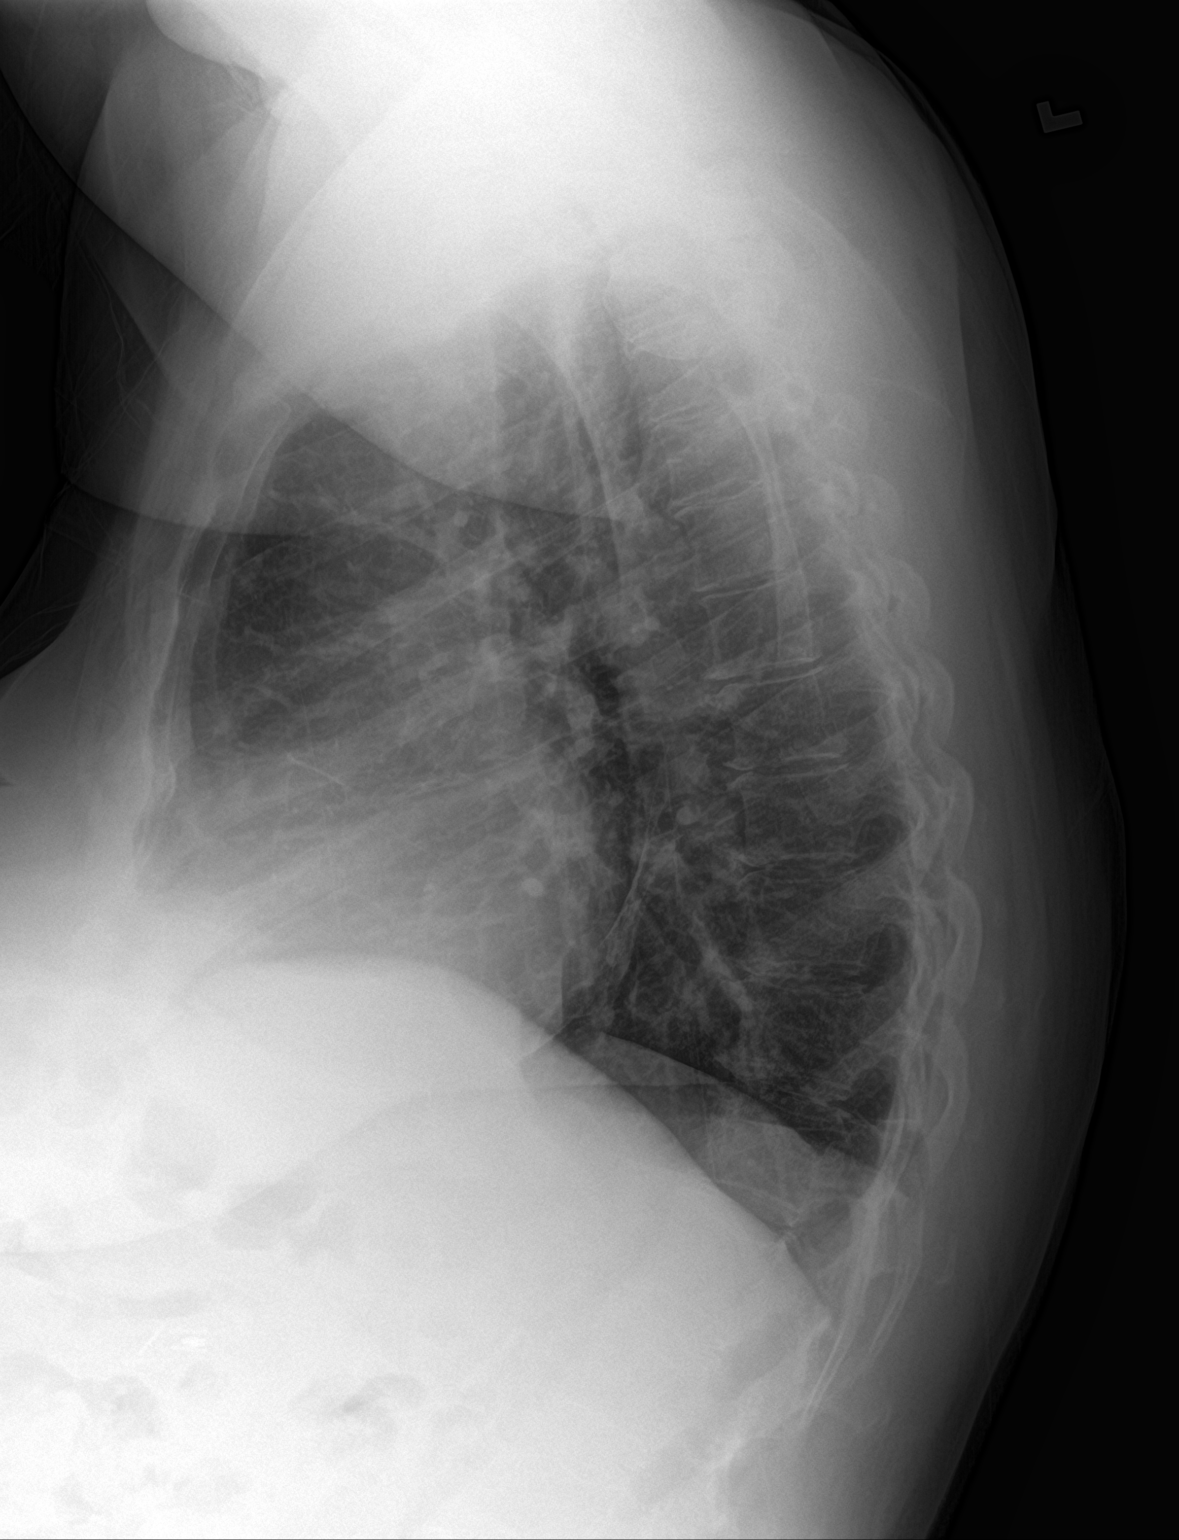

[2 of 2 positions shown; findings below may reference images not displayed]

FINDINGS: The cardiac silhouette is enlarged. There is no significant pleural
effusion or area of consolidation. There is likely mild atelectasis
or scarring at the lung bases bilaterally.
IMPRESSION: No active cardiopulmonary disease.

Cardiomegaly.

## 2023-10-14 ENCOUNTER — Emergency Department (HOSPITAL_BASED_OUTPATIENT_CLINIC_OR_DEPARTMENT_OTHER)

## 2023-10-14 ENCOUNTER — Emergency Department (HOSPITAL_BASED_OUTPATIENT_CLINIC_OR_DEPARTMENT_OTHER)
Admission: EM | Admit: 2023-10-14 | Discharge: 2023-10-14 | Disposition: A | Discharge status: Home / Self Care | Attending: Emergency Medicine | Admitting: Emergency Medicine

## 2023-10-14 ENCOUNTER — Other Ambulatory Visit: Payer: Self-pay

## 2023-10-14 DIAGNOSIS — Z79899 Other long term (current) drug therapy: Secondary | ICD-10-CM | POA: Diagnosis not present

## 2023-10-14 DIAGNOSIS — J45909 Unspecified asthma, uncomplicated: Secondary | ICD-10-CM | POA: Diagnosis not present

## 2023-10-14 DIAGNOSIS — B9689 Other specified bacterial agents as the cause of diseases classified elsewhere: Secondary | ICD-10-CM | POA: Diagnosis not present

## 2023-10-14 DIAGNOSIS — R1032 Left lower quadrant pain: Secondary | ICD-10-CM | POA: Diagnosis present

## 2023-10-14 DIAGNOSIS — N760 Acute vaginitis: Secondary | ICD-10-CM | POA: Insufficient documentation

## 2023-10-14 DIAGNOSIS — I10 Essential (primary) hypertension: Secondary | ICD-10-CM | POA: Insufficient documentation

## 2023-10-14 DIAGNOSIS — Z85828 Personal history of other malignant neoplasm of skin: Secondary | ICD-10-CM | POA: Diagnosis not present

## 2023-10-14 LAB — COMPREHENSIVE METABOLIC PANEL WITH GFR
ALT: 23 U/L (ref 0–44)
AST: 31 U/L (ref 15–41)
Albumin: 4.6 g/dL (ref 3.5–5.0)
Alkaline Phosphatase: 67 U/L (ref 38–126)
Anion gap: 12 (ref 5–15)
BUN: 13 mg/dL (ref 8–23)
CO2: 23 mmol/L (ref 22–32)
Calcium: 9.3 mg/dL (ref 8.9–10.3)
Chloride: 106 mmol/L (ref 98–111)
Creatinine, Ser: 0.83 mg/dL (ref 0.44–1.00)
GFR, Estimated: >60 mL/min (ref >60)
Glucose, Bld: 121 mg/dL — ABNORMAL HIGH (ref 70–99)
Potassium: 3.6 mmol/L (ref 3.5–5.1)
Sodium: 142 mmol/L (ref 135–145)
Total Bilirubin: 0.3 mg/dL (ref 0.0–1.2)
Total Protein: 7 g/dL (ref 6.5–8.1)

## 2023-10-14 LAB — WET PREP, GENITAL
Sperm: NONE SEEN
Trich, Wet Prep: NONE SEEN
WBC, Wet Prep HPF POC: >=10 — AB (ref <10)
Yeast Wet Prep HPF POC: NONE SEEN

## 2023-10-14 LAB — URINALYSIS, ROUTINE W REFLEX MICROSCOPIC
Bilirubin Urine: NEGATIVE
Glucose, UA: NEGATIVE mg/dL
Ketones, ur: NEGATIVE mg/dL
Nitrite: NEGATIVE
Protein, ur: NEGATIVE mg/dL
Specific Gravity, Urine: <=1.005 (ref 1.005–1.030)
pH: 5.5 (ref 5.0–8.0)

## 2023-10-14 LAB — CBC WITH DIFFERENTIAL/PLATELET
Abs Immature Granulocytes: 0.06 10*3/uL (ref 0.00–0.07)
Basophils Absolute: 0.1 10*3/uL (ref 0.0–0.1)
Basophils Relative: 1 %
Eosinophils Absolute: 0.2 10*3/uL (ref 0.0–0.5)
Eosinophils Relative: 2 %
HCT: 39.1 % (ref 36.0–46.0)
Hemoglobin: 13.1 g/dL (ref 12.0–15.0)
Immature Granulocytes: 1 %
Lymphocytes Relative: 24 %
Lymphs Abs: 2.4 10*3/uL (ref 0.7–4.0)
MCH: 30.6 pg (ref 26.0–34.0)
MCHC: 33.5 g/dL (ref 30.0–36.0)
MCV: 91.4 fL (ref 80.0–100.0)
Monocytes Absolute: 1.1 10*3/uL — ABNORMAL HIGH (ref 0.1–1.0)
Monocytes Relative: 10 %
Neutro Abs: 6.4 10*3/uL (ref 1.7–7.7)
Neutrophils Relative %: 62 %
Platelets: 285 10*3/uL (ref 150–400)
RBC: 4.28 MIL/uL (ref 3.87–5.11)
RDW: 13.5 % (ref 11.5–15.5)
WBC: 10.2 10*3/uL (ref 4.0–10.5)
nRBC: 0 % (ref 0.0–0.2)

## 2023-10-14 LAB — URINALYSIS, MICROSCOPIC (REFLEX)

## 2023-10-14 LAB — LIPASE, BLOOD: Lipase: 28 U/L (ref 11–51)

## 2023-10-14 MED ORDER — KETOROLAC TROMETHAMINE 15 MG/ML IJ SOLN
15.0000 mg | Freq: Once | INTRAMUSCULAR | Status: AC
  Administered 2023-10-14: 15 mg via INTRAVENOUS
  Filled 2023-10-14: qty 1

## 2023-10-14 MED ORDER — IOHEXOL 300 MG/ML  SOLN
100.0000 mL | Freq: Once | INTRAMUSCULAR | Status: AC | PRN
  Administered 2023-10-14: 100 mL via INTRAVENOUS

## 2023-10-14 MED ORDER — METRONIDAZOLE 500 MG PO TABS
500.0000 mg | ORAL_TABLET | Freq: Two times a day (BID) | ORAL | 0 refills | Status: AC
Start: 2023-10-14 — End: ?

## 2023-10-14 NOTE — ED Triage Notes (Signed)
 Pt c/o LLQ pain that radiates to LT groin for some time; she has appt with GYN in 2 wks, but pain is bothering her too much to wait

## 2023-10-14 NOTE — ED Notes (Signed)
 Pt has returned to their room

## 2023-10-14 NOTE — ED Notes (Signed)
 Pt taken to [place]

## 2023-10-14 NOTE — Discharge Instructions (Addendum)
 You were evaluated in the emergency room for abdominal pain.  Your lab work was positive for BV.  This is a bacterial vaginal infection.  Antibiotic was sent into your pharmacy.  Please be sure to complete the full course.  Your imaging did not show any significant findings.  Please follow-up with your primary care doctor for further evaluation.

## 2023-10-14 NOTE — ED Notes (Signed)
 Hx of Crohn's disease, continues to eat dairy even though she's been told not to. Had a chicken pot pie today and had some diarrhea which is commonplace for her disease. No issues GI since then, but having L groin pain and abd pain. Hx of multiple abd hernias and inquinal hernia with surgical repair.

## 2023-10-14 NOTE — ED Provider Notes (Signed)
 [facility] Provider Note   CSN: [id number] Arrival date & time: 10/14/23  1346     History  Chief Complaint  Patient presents with   Abdominal Pain    Monica Booth is a 68 y.o. female with history of hypertension, hyperlipidemia, partial hysterectomy, hernia repair presents with complaints of left lower quadrant abdominal pain.  Patient states she has had this pain for years now, but suddenly got worse in the past day.  Endorses nausea without vomiting.  Does report diarrhea, although this is typical for her.  Denies dysuria but does endorse increased frequency.  No vaginal bleeding or discharge.  Last bowel movement was today, no bloody stools noted.   Abdominal Pain  Past Medical History:  Diagnosis Date   Anxiety    Asthma    GERD (gastroesophageal reflux disease)    GI problem    freq n/v   Hyperlipemia    Hypertension    Seizures (HCC)    Skin cancer        Home Medications Prior to Admission medications   Medication Sig Start Date End Date Taking? Authorizing Provider  metroNIDAZOLE (FLAGYL) 500 MG tablet Take 1 tablet (500 mg total) by mouth 2 (two) times daily. 10/14/23  Yes Felicie Horning, PA-C  acetaminophen  (TYLENOL ) 500 MG tablet Take 1,000 mg by mouth every 8 (eight) hours as needed for mild pain or headache.     [provider]  albuterol  (PROAIR  HFA) 108 (90 Base) MCG/ACT inhaler Inhale 2 puffs into the lungs every 6 (six) hours as needed for wheezing or shortness of breath.    [provider]  albuterol  (PROVENTIL ) (2.5 MG/3ML) 0.083% nebulizer solution Take 2.5 mg by nebulization every 6 (six) hours as needed for wheezing or shortness of breath.    [provider]  amLODipine  (NORVASC ) 5 MG tablet Take 5 mg by mouth daily.    [provider]  benzonatate  (TESSALON ) 100 MG capsule Take 1 capsule (100 mg total) by mouth 3 (three) times daily as needed for cough. 09/17/18   Rai,  Hurman Maiden, MD  calcium  carbonate (TUMS EX) 750 MG chewable tablet Chew 1-2 tablets by mouth as needed for heartburn.     [provider]  cetirizine  (ZYRTEC ) 10 MG tablet Take 1 tablet (10 mg total) by mouth daily. Take daily for 1 month, then as needed for allergies 09/17/18   Rai, Ripudeep K, MD  clonazePAM  (KLONOPIN ) 0.5 MG tablet Take 0.5 mg by mouth daily at 12 noon.     [provider]  esomeprazole (NEXIUM) 40 MG capsule Take 40 mg by mouth daily.    [provider]  fluticasone  (FLONASE ) 50 MCG/ACT nasal spray Place 2 sprays into both nostrils daily. Take daily for 1 month, then as needed 09/17/18   Rai, Hurman Maiden, MD  Fluticasone -Salmeterol (ADVAIR DISKUS) 250-50 MCG/DOSE AEPB Inhale 1 puff into the lungs 2 (two) times daily.    [provider]  guaiFENesin  (MUCINEX ) 600 MG 12 hr tablet Take 2 tablets (1,200 mg total) by mouth 2 (two) times daily. 09/17/18   Rai, Hurman Maiden, MD  guaiFENesin -dextromethorphan (ROBITUSSIN DM) 100-10 MG/5ML syrup Take 5 mLs by mouth every 4 (four) hours as needed for cough. Patient not taking: Reported on 09/28/2018 09/17/18   Rai, Hurman Maiden, MD  levETIRAcetam  (KEPPRA ) 500 MG tablet Take 500 mg by mouth 2 (two) times daily.    [provider]  lisinopril  (PRINIVIL ,ZESTRIL ) 40 MG  tablet Take 40 mg by mouth daily.    [provider]  metoprolol  tartrate (LOPRESSOR ) 25 MG tablet Take 0.5 tablets (12.5 mg total) by mouth 2 (two) times daily. 09/17/18   Rai, Hurman Maiden, MD  Multiple Vitamins-Minerals (ONE-A-DAY WOMENS 50+ ADVANTAGE) TABS Take 1 tablet by mouth daily with breakfast.    [provider]  ondansetron  (ZOFRAN  ODT) 4 MG disintegrating tablet Take 1 tablet (4 mg total) by mouth every 8 (eight) hours as needed for nausea or vomiting. Patient not taking: Reported on 09/28/2018 10/13/17   Geiple, Joshua, PA-C  Potassium 99 MG TABS Take 99 mg by mouth daily as needed (for feeling weak).    [provider]  simvastatin  (ZOCOR ) 40 MG tablet Take 40 mg by mouth every evening.     [provider]  sodium chloride  (OCEAN) 0.65 % SOLN nasal spray Place 1-2 sprays into both nostrils as needed for congestion.    [provider]  tetrahydrozoline-zinc (VISINE-AC) 0.05-0.25 % ophthalmic solution Place 2 drops into both eyes 3 (three) times daily as needed (for redness or itching).    [provider]  Vitamin D , Ergocalciferol , (DRISDOL ) 50000 UNITS CAPS capsule Take 50,000 Units by mouth every Monday.     [provider]  zolpidem  (AMBIEN ) 10 MG tablet Take 5 mg by mouth at bedtime.     [provider]      Allergies    Cheese and Yeast    Review of Systems   Review of Systems  Gastrointestinal:  Positive for abdominal pain.    Physical Exam Updated Vital Signs BP (!) 168/66   Pulse 76   Temp 98.1 F (36.7 C) (Oral)   Resp 16   Ht 5\' 1"  (1.549 m)   Wt 90.7 kg   SpO2 98%   BMI 37.79 kg/m  Physical Exam Vitals and nursing note reviewed.  Constitutional:      General: She is not in acute distress.    Appearance: She is well-developed.  HENT:     Head: Normocephalic and atraumatic.  Eyes:     Conjunctiva/sclera: Conjunctivae normal.  Cardiovascular:     Rate and Rhythm: Normal rate and regular rhythm.     Heart sounds: No murmur heard. Pulmonary:     Effort: Pulmonary effort is normal. No respiratory distress.     Breath sounds: Normal breath sounds.  Abdominal:     Palpations: Abdomen is soft.     Tenderness: There is abdominal tenderness.     Comments: Left lower quadrant tenderness, no peritoneal signs, surgical scars noted  Musculoskeletal:        General: No swelling.     Cervical back: Neck supple.  Skin:    General: Skin is warm and dry.     Capillary Refill: Capillary refill takes less than 2 seconds.  Neurological:     Mental Status: She is alert.  Psychiatric:        Mood and Affect: Mood normal.     ED  Results / Procedures / Treatments   Labs (all labs ordered are listed, but only abnormal results are displayed) Labs Reviewed  WET PREP, GENITAL - Abnormal; Notable for the following components:      Result Value   Clue Cells Wet Prep HPF POC PRESENT (*)    WBC, Wet Prep HPF POC >=10 (*)    All other components within normal limits  CBC WITH DIFFERENTIAL/PLATELET - Abnormal; Notable for the following components:  Monocytes Absolute 1.1 (*)    All other components within normal limits  COMPREHENSIVE METABOLIC PANEL WITH GFR - Abnormal; Notable for the following components:   Glucose, Bld 121 (*)    All other components within normal limits  URINALYSIS, ROUTINE W REFLEX MICROSCOPIC - Abnormal; Notable for the following components:   Hgb urine dipstick TRACE (*)    Leukocytes,Ua TRACE (*)    All other components within normal limits  URINALYSIS, MICROSCOPIC (REFLEX) - Abnormal; Notable for the following components:   Bacteria, UA FEW (*)    All other components within normal limits  LIPASE, BLOOD    EKG None  Radiology CT ABDOMEN PELVIS W CONTRAST Result Date: 10/14/2023 CLINICAL DATA:  Left lower quadrant abdominal pain EXAM: CT ABDOMEN AND PELVIS WITH CONTRAST TECHNIQUE: Multidetector CT imaging of the abdomen and pelvis was performed using the standard protocol following bolus administration of intravenous contrast. RADIATION DOSE REDUCTION: This exam was performed according to the departmental dose-optimization program which includes automated exposure control, adjustment of the mA and/or kV according to patient size and/or use of iterative reconstruction technique. CONTRAST:  100mL OMNIPAQUE IOHEXOL 300 MG/ML  SOLN COMPARISON:  CT abdomen pelvis dated 07/09/2020. FINDINGS: Lower chest: The visualized lung bases are clear. No intra-abdominal free air or free fluid. Hepatobiliary: The liver is unremarkable. No biliary dilatation. Cholecystectomy. Pancreas: Unremarkable. No pancreatic  ductal dilatation or surrounding inflammatory changes. Spleen: Normal in size without focal abnormality. Adrenals/Urinary Tract: The adrenal glands unremarkable. There is no hydronephrosis on either side. Bilateral parapelvic cysts. There is symmetric enhancement and excretion of contrast by both kidneys. The visualized ureters and urinary bladder appear unremarkable. Stomach/Bowel: There is a 2.5 cm duodenal diverticulum. There is sigmoid diverticulosis. There is no bowel obstruction or active inflammation. The appendix is normal. Vascular/Lymphatic: Moderate aortoiliac atherosclerotic disease. The IVC is unremarkable no portal venous gas. There is no adenopathy. Reproductive: Hysterectomy.  No suspicious adnexal masses. Other: Anterior pelvic hernia repair mesh. Musculoskeletal: No acute or significant osseous findings. IMPRESSION: 1. No acute intra-abdominal or pelvic pathology. 2. Sigmoid diverticulosis. No bowel obstruction. Normal appendix. 3.  Aortic Atherosclerosis (ICD10-I70.0). Electronically Signed   By: Angus Bark M.D.   On: 10/14/2023 17:34   US  PELVIC COMPLETE W TRANSVAGINAL AND TORSION R/O Result Date: 10/14/2023 CLINICAL DATA:  696295 Pain [id number] EXAM: TRANSVAGINAL ULTRASOUND OF PELVIS DOPPLER ULTRASOUND OF OVARIES TECHNIQUE: Transvaginal ultrasound examination of the pelvis was performed including evaluation of the uterus, ovaries, adnexal regions, and pelvic cul-de-sac. Color and duplex Doppler ultrasound was utilized to evaluate blood flow to the ovaries. COMPARISON:  None Available. FINDINGS: Uterus Surgically absent. Right ovary Not visualized.  No adnexal mass seen on the provided images. Left ovary Not visualized.  No adnexal mass seen on the provided images. Other findings:  No abnormal free fluid IMPRESSION: *Status post hysterectomy. Nonvisualization of bilateral ovaries. No adnexal mass seen on the provided images. Electronically Signed   By: Beula Brunswick M.D.   On: 10/14/2023  16:02    Procedures Procedures    Medications Ordered in ED Medications  ketorolac (TORADOL) 15 MG/ML injection 15 mg (15 mg Intravenous Given 10/14/23 1525)  iohexol (OMNIPAQUE) 300 MG/ML solution 100 mL (100 mLs Intravenous Contrast Given 10/14/23 1658)    ED Course/ Medical Decision Making/ A&P                                 Medical Decision Making  Amount and/or Complexity of Data Reviewed Labs: ordered. Radiology: ordered.  Risk Prescription drug management.   This patient presents to the ED with chief complaint(s) of abdominal pain.  The complaint involves an extensive differential diagnosis and also carries with it a high risk of complications and morbidity.   Pertinent past medical history as listed in HPI  The differential diagnosis includes  Diverticulitis, ovarian cyst, ovarian torsion, cystitis, pyelonephritis, nephrolithiasis Additional history obtained: records reviewed Care Everywhere/External Records  Assessment and management:   Patient presents hypertensive with complaints of left lower quadrant abdominal pain.  Pain is acute on chronic, worsening within the past day.  It is associated with nausea without vomiting.  Endorses diarrhea at baseline.  Endorses dysuria without increased frequency.  No vaginal symptoms.  Bowel movements are regular without bloody stools.  Does have a history of multiple hernia repairs, cholecystectomy, hysterectomy with unilateral oophorectomy.  Notes that she had " many cysts on her ovaries".  She is unsure what ovary was removed.  Patient is positive for BV.  Otherwise her imaging and lab work is overall unremarkable.  Will send a prescription for Flagyl.  She has an appointment with her OB/GYN upcoming.  Independent ECG interpretation:  none  Independent labs interpretation:  The following labs were independently interpreted:  CBC and CMP without significant findings, lipase within normal limits, UA with trace leukocytes,  negative nitrates few WBCs  Independent visualization and interpretation of imaging: I independently visualized the following imaging with scope of interpretation limited to determining acute life threatening conditions related to emergency care:  Pelvic ultrasound status post hysterectomy, ovaries not well-visualized no adnexal mass CT abdomen pelvis no acute abnormality  Consultations obtained:   none  Disposition:   Patient will be discharged home. The patient has been appropriately medically screened and/or stabilized in the ED. I have low suspicion for any other emergent medical condition which would require further screening, evaluation or treatment in the ED or require inpatient management. At time of discharge the patient is hemodynamically stable and in no acute distress. I have discussed work-up results and diagnosis with patient and answered all questions. Patient is agreeable with discharge plan. We discussed strict return precautions for returning to the emergency department and they verbalized understanding.     Social Determinants of Health:   none  This note was dictated with voice recognition software.  Despite best efforts at proofreading, errors may have occurred which can change the documentation meaning.          Final Clinical Impression(s) / ED Diagnoses Final diagnoses:  BV (bacterial vaginosis)    Rx / DC Orders ED Discharge Orders          Ordered    metroNIDAZOLE (FLAGYL) 500 MG tablet  2 times daily        10/14/23 1849              Felicie Horning, PA-C 10/14/23 1901    Lowery Rue, DO 10/15/23 [phone]

## 2024-02-17 ENCOUNTER — Emergency Department (HOSPITAL_BASED_OUTPATIENT_CLINIC_OR_DEPARTMENT_OTHER): Admission: EM | Admit: 2024-02-17 | Discharge: 2024-02-17 | Disposition: A | Discharge status: Home / Self Care

## 2024-02-17 ENCOUNTER — Emergency Department (HOSPITAL_BASED_OUTPATIENT_CLINIC_OR_DEPARTMENT_OTHER)

## 2024-02-17 ENCOUNTER — Encounter (HOSPITAL_BASED_OUTPATIENT_CLINIC_OR_DEPARTMENT_OTHER): Payer: Self-pay | Admitting: Emergency Medicine

## 2024-02-17 ENCOUNTER — Other Ambulatory Visit: Payer: Self-pay

## 2024-02-17 DIAGNOSIS — Z87891 Personal history of nicotine dependence: Secondary | ICD-10-CM | POA: Insufficient documentation

## 2024-02-17 DIAGNOSIS — R059 Cough, unspecified: Secondary | ICD-10-CM | POA: Diagnosis present

## 2024-02-17 DIAGNOSIS — J45909 Unspecified asthma, uncomplicated: Secondary | ICD-10-CM | POA: Diagnosis not present

## 2024-02-17 DIAGNOSIS — I10 Essential (primary) hypertension: Secondary | ICD-10-CM | POA: Diagnosis not present

## 2024-02-17 DIAGNOSIS — R0602 Shortness of breath: Secondary | ICD-10-CM | POA: Insufficient documentation

## 2024-02-17 LAB — RESP PANEL BY RT-PCR (RSV, FLU A&B, COVID)  RVPGX2
Influenza A by PCR: NEGATIVE
Influenza B by PCR: NEGATIVE
Resp Syncytial Virus by PCR: NEGATIVE
SARS Coronavirus 2 by RT PCR: NEGATIVE

## 2024-02-17 LAB — GROUP A STREP BY PCR: Group A Strep by PCR: NOT DETECTED

## 2024-02-17 MED ORDER — BENZONATATE 100 MG PO CAPS: 100.0000 mg | ORAL_CAPSULE | Freq: Three times a day (TID) | ORAL | 0 refills | Status: AC

## 2024-02-17 MED ORDER — IPRATROPIUM-ALBUTEROL 0.5-2.5 (3) MG/3ML IN SOLN
3.0000 mL | Freq: Once | RESPIRATORY_TRACT | Status: AC
  Administered 2024-02-17: 3 mL via RESPIRATORY_TRACT
  Filled 2024-02-17: qty 3

## 2024-02-17 NOTE — ED Triage Notes (Signed)
 Feeling bad x 1 month now not sleeping has a cough and wheeze , states has been tested for covid not given any answers  she states  has seen a dr

## 2024-02-17 NOTE — ED Provider Notes (Signed)
 Monica Booth   CSN: [id number] Arrival date & time: 02/17/24  1045     Patient presents with: Cough   Monica Booth is a 68 y.o. female.   68 year old female presenting with cough.  Patient reports that symptoms been ongoing for 1 month, she has been seen multiple times by her PCP at [facility] and has been treated with several rounds of antibiotics, when asked what she is being treated for antibiotics for she says a cold.  She notes symptoms including productive cough that is worse at night as well as sore throat and a subjective fever last night.  She tells me that she was first prescribed azithromycin, was then switched to two separate FQ's, she was also seen by them yesterday and given 2 shots, one she was told was an antibiotic and one she was told was a steroid.  She has also completed a course of prednisone.  When asked why she is presenting here today rather than following up at [facility], she states because they do not know what they are doing.  She denies history of COPD, does report history of asthma which she manages with the use of inhalers. Denies chest pain, shortness of breath, LE edema, history of PE/DVT.    Cough      Prior to Admission medications   Medication Sig Start Date End Date Taking? Authorizing Provider  acetaminophen  (TYLENOL ) 500 MG tablet Take 1,000 mg by mouth every 8 (eight) hours as needed for mild pain or headache.     [provider]  albuterol  (PROAIR  HFA) 108 (90 Base) MCG/ACT inhaler Inhale 2 puffs into the lungs every 6 (six) hours as needed for wheezing or shortness of breath.    [provider]  albuterol  (PROVENTIL ) (2.5 MG/3ML) 0.083% nebulizer solution Take 2.5 mg by nebulization every 6 (six) hours as needed for wheezing or shortness of breath.    [provider]  amLODipine  (NORVASC ) 5 MG tablet Take 5 mg by mouth daily.    [provider]   benzonatate  (TESSALON ) 100 MG capsule Take 1 capsule (100 mg total) by mouth 3 (three) times daily as needed for cough. 09/17/18   Rai, Nydia POUR, MD  calcium  carbonate (TUMS EX) 750 MG chewable tablet Chew 1-2 tablets by mouth as needed for heartburn.     [provider]  cetirizine  (ZYRTEC ) 10 MG tablet Take 1 tablet (10 mg total) by mouth daily. Take daily for 1 month, then as needed for allergies 09/17/18   Rai, Ripudeep K, MD  clonazePAM  (KLONOPIN ) 0.5 MG tablet Take 0.5 mg by mouth daily at 12 noon.     [provider]  esomeprazole (NEXIUM) 40 MG capsule Take 40 mg by mouth daily.    [provider]  fluticasone  (FLONASE ) 50 MCG/ACT nasal spray Place 2 sprays into both nostrils daily. Take daily for 1 month, then as needed 09/17/18   Rai, Ripudeep K, MD  Fluticasone -Salmeterol (ADVAIR DISKUS) 250-50 MCG/DOSE AEPB Inhale 1 puff into the lungs 2 (two) times daily.    [provider]  guaiFENesin  (MUCINEX ) 600 MG 12 hr tablet Take 2 tablets (1,200 mg total) by mouth 2 (two) times daily. 09/17/18   Rai, Nydia POUR, MD  guaiFENesin -dextromethorphan (ROBITUSSIN DM) 100-10 MG/5ML syrup Take 5 mLs by mouth every 4 (four) hours as needed for cough. Patient not taking: Reported on 09/28/2018 09/17/18   Rai, Nydia POUR, MD  levETIRAcetam  (KEPPRA ) 500 MG  tablet Take 500 mg by mouth 2 (two) times daily.    [provider]  lisinopril  (PRINIVIL ,ZESTRIL ) 40 MG tablet Take 40 mg by mouth daily.    [provider]  metoprolol  tartrate (LOPRESSOR ) 25 MG tablet Take 0.5 tablets (12.5 mg total) by mouth 2 (two) times daily. 09/17/18   Rai, Nydia POUR, MD  metroNIDAZOLE  (FLAGYL ) 500 MG tablet Take 1 tablet (500 mg total) by mouth 2 (two) times daily. 10/14/23   Donnajean Lynwood DEL, PA-C  Multiple Vitamins-Minerals (ONE-A-DAY WOMENS 50+ ADVANTAGE) TABS Take 1 tablet by mouth daily with breakfast.    [provider]  ondansetron  (ZOFRAN  ODT) 4 MG disintegrating tablet  Take 1 tablet (4 mg total) by mouth every 8 (eight) hours as needed for nausea or vomiting. Patient not taking: Reported on 09/28/2018 10/13/17   Geiple, Joshua, PA-C  Potassium 99 MG TABS Take 99 mg by mouth daily as needed (for feeling weak).    [provider]  simvastatin  (ZOCOR ) 40 MG tablet Take 40 mg by mouth every evening.     [provider]  sodium chloride  (OCEAN) 0.65 % SOLN nasal spray Place 1-2 sprays into both nostrils as needed for congestion.    [provider]  tetrahydrozoline-zinc (VISINE-AC) 0.05-0.25 % ophthalmic solution Place 2 drops into both eyes 3 (three) times daily as needed (for redness or itching).    [provider]  Vitamin D , Ergocalciferol , (DRISDOL ) 50000 UNITS CAPS capsule Take 50,000 Units by mouth every Monday.     [provider]  zolpidem  (AMBIEN ) 10 MG tablet Take 5 mg by mouth at bedtime.     [provider]    Allergies: Cheese and Yeast    Review of Systems  Respiratory:  Positive for cough.     Updated Vital Signs  Vitals:   02/17/24 1055 02/17/24 1059 02/17/24 1346  BP: (!) 176/92    Pulse: 62    Resp: 18    Temp: 97.9 F (36.6 C)    SpO2: 100%  100%  Weight:  86.2 kg   Height:  5' 1 (1.549 m)      Physical Exam Vitals and nursing Booth reviewed.  HENT:     Head: Normocephalic.     Mouth/Throat:     Mouth: Mucous membranes are moist.     Pharynx: No pharyngeal swelling, oropharyngeal exudate, posterior oropharyngeal erythema or uvula swelling.     Tonsils: No tonsillar exudate or tonsillar abscesses.  Eyes:     Extraocular Movements: Extraocular movements intact.  Cardiovascular:     Rate and Rhythm: Normal rate and regular rhythm.     Heart sounds: Murmur heard.  Pulmonary:     Effort: Pulmonary effort is normal. No respiratory distress.     Comments: Trace wheeze Musculoskeletal:     Cervical back: Normal range of motion.     Right lower leg: No edema.     Left lower  leg: No edema.     Comments: Moves all extremities spontaneously without difficulty  Skin:    General: Skin is warm and dry.  Neurological:     Mental Status: She is alert and oriented to person, place, and time.     (all labs ordered are listed, but only abnormal results are displayed) Labs Reviewed  RESP PANEL BY RT-PCR (RSV, FLU A&B, COVID)  [id number]  GROUP A STREP BY PCR    EKG: None  Radiology: DG Chest 2 View Result Date: 02/17/2024 CLINICAL DATA:  Shortness  of breath.  Cough. EXAM: CHEST - 2 VIEW COMPARISON:  07/09/2020. FINDINGS: Stable cardiomegaly. Mediastinal contours are within normal limits. No pulmonary edema, focal consolidation, pleural effusion, or pneumothorax. Degenerative changes of the thoracic spine. No acute osseous abnormality. IMPRESSION: 1. No acute cardiopulmonary findings. 2. Cardiomegaly. Electronically Signed   By: Harrietta Sherry M.D.   On: 02/17/2024 11:47     Procedures   Medications Ordered in the ED  ipratropium-albuterol  (DUONEB) 0.5-2.5 (3) MG/3ML nebulizer solution 3 mL (3 mLs Nebulization Given 02/17/24 1345)                                    Medical Decision Making This patient presents to the ED for concern of cough, this involves an extensive number of treatment options, and is a complaint that carries with it a high risk of complications and morbidity.  The differential diagnosis includes COVID/flu/RSV, pneumonia, COPD exacerbation, asthma exacerbation   Co morbidities that complicate the patient evaluation  Hypertension, GERD   Additional history obtained:  Additional history obtained from record review External records from outside source obtained and reviewed including prior ED Booth Unfortunately patient is established with [facility] and I am unable to of view her recent appointments with them, as she mentions that she has had several over the past month for this issue.   Lab Tests:  I Ordered, and personally  interpreted labs.  The pertinent results include: COVID/flu/RSV negative. Strep PCR negative   Imaging Studies ordered:  I ordered imaging studies including CXR  I independently visualized and interpreted imaging which showed 1. No acute cardiopulmonary findings. 2. Cardiomegaly.  I agree with the radiologist interpretation   Cardiac Monitoring: / EKG:  The patient was maintained on a cardiac monitor.  I personally viewed and interpreted the cardiac monitored which showed an underlying rhythm of: NSR   Problem List / ED Course / Critical interventions / Medication management  I ordered medication including DuoNeb for mild wheeze Reevaluation of the patient after these medicines showed that the patient improved I have reviewed the patients home medicines and have made adjustments as needed   Social Determinants of Health:  Former tobacco use   Test / Admission - Considered:  Physical exam is notable as above, patient does not exhibit any signs/symptoms consistent with respiratory distress, she does have a trace wheeze that was auscultated particularly in the right lung fields > left.  Chest x-ray is reassuring, no focal consolidation consistent with pneumonia, COVID/flu/RSV/strep testing was negative.  Patient received DuoNeb treatment while in the emergency department today, reports symptomatic improvement.  She does endorse a history of asthma, when asked about a COPD history she declines, however when asked by nursing staff if she does have a history of COPD she says yes, she is a very poor historian and is unclear what her true underlying diagnosis is, but it seems that based on her report of multiple rounds of antibiotics/steroids through [facility] that she may be under treatment for a COPD exacerbation.  She also has 2 inhalers at home, and she tells me that she is prescribed these for asthma and is using these as directed.  Patient was given IM steroid and IM antibiotic in  the office at [facility] yesterday, she has also completed at least 3 rounds of antibiotics in the past month for this.  At this point I do not feel that additional steroid/antibiotics  are warranted, given that she is followed closely by [facility] for this issue.  Will prescribe Tessalon  Perles, but I recommend the patient discuss this with her PCP if her cough persists.  She plans to establish with a new PCP as she is unhappy with her treatment over the past month for this issue.  Return precautions discussed, she is appropriate for discharge at this time.  Staffed with Dr. Simon  Amount and/or Complexity of Data Reviewed Radiology: ordered.  Risk Prescription drug management.        Final diagnoses:  Cough, unspecified type    ED Discharge Orders          Ordered    benzonatate  (TESSALON ) 100 MG capsule  Every 8 hours        02/17/24 1430               Glendia Rocky SAILOR, PA-C 02/17/24 1442    Simon Lavonia SAILOR, MD 02/17/24 1525

## 2024-02-17 NOTE — Discharge Instructions (Addendum)
 Follow-up with your PCP at [facility] as scheduled.  Start Tessalon , take once every 8 hours as needed for cough.  Return to the emergency department if your symptoms worsen.
